# Patient Record
Sex: Female | Born: 1975 | Race: Black or African American | Hispanic: No | Marital: Married | Smoking: Never smoker
Health system: Southern US, Community
[De-identification: ages and names within clinical notes are randomized; demographics above are authoritative.]

## PROBLEM LIST (undated history)

## (undated) DIAGNOSIS — N3090 Cystitis, unspecified without hematuria: Secondary | ICD-10-CM

## (undated) DIAGNOSIS — Z9889 Other specified postprocedural states: Secondary | ICD-10-CM

## (undated) DIAGNOSIS — R112 Nausea with vomiting, unspecified: Secondary | ICD-10-CM

## (undated) DIAGNOSIS — D219 Benign neoplasm of connective and other soft tissue, unspecified: Secondary | ICD-10-CM

## (undated) DIAGNOSIS — K219 Gastro-esophageal reflux disease without esophagitis: Secondary | ICD-10-CM

## (undated) DIAGNOSIS — D759 Disease of blood and blood-forming organs, unspecified: Secondary | ICD-10-CM

## (undated) DIAGNOSIS — N904 Leukoplakia of vulva: Secondary | ICD-10-CM

## (undated) DIAGNOSIS — R102 Pelvic and perineal pain unspecified side: Secondary | ICD-10-CM

## (undated) DIAGNOSIS — G43909 Migraine, unspecified, not intractable, without status migrainosus: Secondary | ICD-10-CM

## (undated) DIAGNOSIS — K589 Irritable bowel syndrome without diarrhea: Secondary | ICD-10-CM

## (undated) DIAGNOSIS — B379 Candidiasis, unspecified: Secondary | ICD-10-CM

## (undated) DIAGNOSIS — Z8619 Personal history of other infectious and parasitic diseases: Secondary | ICD-10-CM

## (undated) DIAGNOSIS — B019 Varicella without complication: Secondary | ICD-10-CM

## (undated) DIAGNOSIS — E611 Iron deficiency: Secondary | ICD-10-CM

## (undated) HISTORY — DX: Varicella without complication: B01.9

## (undated) HISTORY — DX: Personal history of other infectious and parasitic diseases: Z86.19

## (undated) HISTORY — DX: Migraine, unspecified, not intractable, without status migrainosus: G43.909

## (undated) HISTORY — DX: Irritable bowel syndrome without diarrhea: K58.9

## (undated) HISTORY — DX: Benign neoplasm of connective and other soft tissue, unspecified: D21.9

## (undated) HISTORY — DX: Pelvic and perineal pain: R10.2

## (undated) HISTORY — DX: Candidiasis, unspecified: B37.9

## (undated) HISTORY — DX: Iron deficiency: E61.1

## (undated) HISTORY — DX: Pelvic and perineal pain unspecified side: R10.20

## (undated) HISTORY — DX: Cystitis, unspecified without hematuria: N30.90

## (undated) HISTORY — DX: Leukoplakia of vulva: N90.4

## (undated) HISTORY — PX: LAPAROSCOPY FOR ECTOPIC PREGNANCY: SUR765

## (undated) HISTORY — PX: ABDOMINAL HYSTERECTOMY: SHX81

## (undated) HISTORY — PX: APPENDECTOMY: SHX54

---
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
## 1997-08-01 ENCOUNTER — Emergency Department (HOSPITAL_COMMUNITY): Admission: EM | Admit: 1997-08-01 | Discharge: 1997-08-02 | Payer: Self-pay | Admitting: Emergency Medicine

## 1998-01-16 ENCOUNTER — Inpatient Hospital Stay (HOSPITAL_COMMUNITY): Admission: AD | Admit: 1998-01-16 | Discharge: 1998-01-16 | Payer: Self-pay | Admitting: Obstetrics and Gynecology

## 1998-03-28 ENCOUNTER — Inpatient Hospital Stay (HOSPITAL_COMMUNITY): Admission: AD | Admit: 1998-03-28 | Discharge: 1998-03-30 | Payer: Self-pay | Admitting: *Deleted

## 1998-05-02 ENCOUNTER — Other Ambulatory Visit: Admission: RE | Admit: 1998-05-02 | Discharge: 1998-05-02 | Payer: Self-pay | Admitting: *Deleted

## 2003-12-05 ENCOUNTER — Other Ambulatory Visit: Admission: RE | Admit: 2003-12-05 | Discharge: 2003-12-05 | Payer: Self-pay | Admitting: Family Medicine

## 2003-12-18 ENCOUNTER — Ambulatory Visit (HOSPITAL_COMMUNITY): Admission: RE | Admit: 2003-12-18 | Discharge: 2003-12-18 | Payer: Self-pay | Admitting: Obstetrics and Gynecology

## 2004-04-08 ENCOUNTER — Encounter: Admission: RE | Admit: 2004-04-08 | Discharge: 2004-04-08 | Payer: Self-pay | Admitting: Obstetrics and Gynecology

## 2005-01-09 ENCOUNTER — Other Ambulatory Visit: Admission: RE | Admit: 2005-01-09 | Discharge: 2005-01-09 | Payer: Self-pay | Admitting: Family Medicine

## 2006-06-22 ENCOUNTER — Encounter: Admission: RE | Admit: 2006-06-22 | Discharge: 2006-06-22 | Payer: Self-pay | Admitting: Family Medicine

## 2006-08-05 ENCOUNTER — Other Ambulatory Visit: Admission: RE | Admit: 2006-08-05 | Discharge: 2006-08-05 | Payer: Self-pay | Admitting: Family Medicine

## 2008-06-13 ENCOUNTER — Other Ambulatory Visit: Admission: RE | Admit: 2008-06-13 | Discharge: 2008-06-13 | Payer: Self-pay | Admitting: Family Medicine

## 2008-06-15 ENCOUNTER — Encounter: Admission: RE | Admit: 2008-06-15 | Discharge: 2008-06-15 | Payer: Self-pay | Admitting: Family Medicine

## 2008-08-21 ENCOUNTER — Encounter: Admission: RE | Admit: 2008-08-21 | Discharge: 2008-08-21 | Payer: Self-pay | Admitting: Family Medicine

## 2009-09-07 ENCOUNTER — Other Ambulatory Visit: Admission: RE | Admit: 2009-09-07 | Discharge: 2009-09-07 | Payer: Self-pay | Admitting: Family Medicine

## 2010-03-03 HISTORY — PX: LAPAROSCOPY: SHX197

## 2010-03-03 HISTORY — PX: TUBAL LIGATION: SHX77

## 2010-04-10 ENCOUNTER — Ambulatory Visit
Admission: RE | Admit: 2010-04-10 | Discharge: 2010-04-10 | Disposition: A | Discharge status: Home / Self Care | Payer: BC Managed Care – PPO | Source: Ambulatory Visit | Attending: Family Medicine | Admitting: Family Medicine

## 2010-04-10 ENCOUNTER — Other Ambulatory Visit: Payer: Self-pay | Admitting: Family Medicine

## 2010-04-10 DIAGNOSIS — R928 Other abnormal and inconclusive findings on diagnostic imaging of breast: Secondary | ICD-10-CM

## 2010-04-16 ENCOUNTER — Ambulatory Visit
Admission: RE | Admit: 2010-04-16 | Discharge: 2010-04-16 | Disposition: A | Discharge status: Home / Self Care | Payer: BC Managed Care – PPO | Source: Ambulatory Visit | Attending: Family Medicine | Admitting: Family Medicine

## 2010-04-16 ENCOUNTER — Other Ambulatory Visit: Payer: Self-pay | Admitting: Family Medicine

## 2010-04-16 DIAGNOSIS — N6310 Unspecified lump in the right breast, unspecified quadrant: Secondary | ICD-10-CM

## 2010-04-16 DIAGNOSIS — R928 Other abnormal and inconclusive findings on diagnostic imaging of breast: Secondary | ICD-10-CM

## 2010-05-13 DIAGNOSIS — N3090 Cystitis, unspecified without hematuria: Secondary | ICD-10-CM

## 2010-05-13 DIAGNOSIS — G43909 Migraine, unspecified, not intractable, without status migrainosus: Secondary | ICD-10-CM

## 2010-05-13 DIAGNOSIS — K589 Irritable bowel syndrome without diarrhea: Secondary | ICD-10-CM

## 2010-05-13 DIAGNOSIS — B019 Varicella without complication: Secondary | ICD-10-CM

## 2010-05-13 HISTORY — DX: Varicella without complication: B01.9

## 2010-05-13 HISTORY — DX: Migraine, unspecified, not intractable, without status migrainosus: G43.909

## 2010-05-13 HISTORY — DX: Irritable bowel syndrome, unspecified: K58.9

## 2010-05-13 HISTORY — DX: Cystitis, unspecified without hematuria: N30.90

## 2010-05-31 NOTE — H&P (Signed)
[name], [name]               ACCOUNT NO.:  [id number]  MEDICAL RECORD NO.:  [id number]           PATIENT TYPE:  O  LOCATION:  [facility]                           [facility]ILITY:  [facility]  PHYSICIAN:  Hal Morales, M.D.DATE OF BIRTH:  05-09-1975  DATE OF ADMISSION: DATE OF DISCHARGE:                             HISTORY & PHYSICAL   HISTORY OF PRESENT ILLNESS:  The patient is a 35 year old black female who presents complaining of severe dysmenorrhea.  She also has some intermittent pelvic pain that is not associated with her menses.  This has been going on for approximately 3 years.  The pain is normally on the left side, but has also been bilateral at times.  She has had an ultrasound of her pelvis approximately 2 years ago, which showed an ovarian cyst; however, her most recent ultrasound shows normal ovaries bilaterally.  The pain can last as much as 7 days after her menses.  She usually does require over-the-counter pain medication.  She rarely misses any of her usual activities including work because of this pain.  She has menses that ranging in length from every 21 to every 28 days and occasionally will have menses less than 21 days apart.  She does occasionally have intermenstrual bleeding.  OBSTETRICAL HISTORY:  The patient had normal spontaneous vaginal deliveries 21 years ago and 12 years ago.  She did in 1994 had an ectopic pregnancy with removal of her fallopian tubes, though she does not remember which side.  GYN HISTORY:  She has no history of abnormal Pap smears and had her most recent Pap smear in July 2011, which was normal.  She has also had a laparoscopic tubal cautery in 2006 that she uses for contraception.  She has a history of gonorrhea in the remote past, which was appropriately treated.  SURGICAL HISTORY:  She underwent an open appendectomy at age 58.  She had surgery for left leg fracture. She has also had the aforementioned laparoscopic tubal cautery and  salpingectomy for ectopic.  MEDICAL HISTORY:  The patient has a history of migraines with her menses, which respond to extra strength Tylenol.  She has a history of irritable bowel syndrome with constipation, usually heralded by severe abdominal pain.  CURRENT MEDICATIONS:  None regularly.  DRUG ALLERGIES:  None known.  FAMILY HISTORY:  Positive for migraine headaches.  REVIEW OF SYSTEMS:  Positive for urinary frequency, which is a long-term concern.  The aforementioned constipation with irritable bowel syndrome. She also sometimes has nausea associated with abdominal pain.  She has premenstrual symptomatology, primarily mood swings and premenstrual headache and abdominal pain.  SOCIAL HISTORY:  The patient works in Clinical biochemist.  She is of the [place] religion.  She is married.  She does not smoke cigarettes. She may have alcoholic beverage once or twice per week.  PHYSICAL EXAMINATION:  GENERAL:  The patient is a well-developed and black female in no acute distress. VITAL SIGNS:  Blood pressure is 110/70, weight is 147 pounds, height 5 feet 4-3/4 inches. HEENT:  The thyroid is normal. LUNGS:  Clear. HEART:  Regular rate and rhythm. ABDOMEN:  Soft without masses or organomegaly. PELVIC:  External genitalia show hypopigmentation at the posterior fourchette and on the left clitoral hood.  The vagina is rugous.  The cervix is without gross lesions and nontender.  The uterus is upper limits of normal size with decreased mobility.  Adnexae no masses. Rectovaginal no masses.  Ultrasound showed the uterus to be anteverted and measures 4.7 x 9 x 5.2.  The right and left ovaries are normal with no fluid in the cul-de-sac and endometrium appears normal.  There is a posterior subserosal fibroid measuring 1.89 x 2.31 cm.  IMPRESSION: 1. Three-year history of perimenstrual and intermenstrual pelvic pain. 2. Hypopigmentation of the posterior fourchette, question lichen      sclerosus.  DISPOSITION:  Options for management of the patient's pelvic pain were reviewed and recommendation made for laparoscopy.  The indications, risks and benefits of laparoscopy were reviewed including the risk of anesthesia, bleeding, infection and damage to adjacent organs.  The patient seemed to understand and wishes to proceed.  This will be done at [facility] on June 14, 2010, at which time, she will also undergo vulvar biopsy.     Hal Morales, M.D.     VPH/MEDQ  D:  05/29/2010  T:  05/30/2010  Job:  [id number]  Electronically Signed by Dierdre Forth M.D. on 05/31/2010 05:04:30 PM

## 2010-06-02 HISTORY — PX: LAPAROSCOPIC OVARIAN CYSTECTOMY: SUR786

## 2010-06-07 ENCOUNTER — Encounter (HOSPITAL_COMMUNITY)
Admission: RE | Admit: 2010-06-07 | Discharge: 2010-06-07 | Disposition: A | Discharge status: Home / Self Care | Payer: BC Managed Care – PPO | Source: Ambulatory Visit | Attending: Obstetrics and Gynecology | Admitting: Obstetrics and Gynecology

## 2010-06-07 LAB — SURGICAL PCR SCREEN: Staphylococcus aureus: NEGATIVE

## 2010-06-07 LAB — CBC
MCH: 29.7 pg (ref 26.0–34.0)
MCHC: 32.1 g/dL (ref 30.0–36.0)
RBC: 4.07 MIL/uL (ref 3.87–5.11)
WBC: 5.8 10*3/uL (ref 4.0–10.5)

## 2010-06-14 ENCOUNTER — Ambulatory Visit (HOSPITAL_COMMUNITY)
Admission: RE | Admit: 2010-06-14 | Discharge: 2010-06-14 | Disposition: A | Discharge status: Home / Self Care | Payer: BC Managed Care – PPO | Source: Ambulatory Visit | Attending: Obstetrics and Gynecology | Admitting: Obstetrics and Gynecology

## 2010-06-14 ENCOUNTER — Other Ambulatory Visit: Payer: Self-pay | Admitting: Obstetrics and Gynecology

## 2010-06-14 DIAGNOSIS — N8000 Endometriosis of the uterus, unspecified: Secondary | ICD-10-CM | POA: Insufficient documentation

## 2010-06-14 DIAGNOSIS — N904 Leukoplakia of vulva: Secondary | ICD-10-CM | POA: Insufficient documentation

## 2010-06-14 DIAGNOSIS — L940 Localized scleroderma [morphea]: Secondary | ICD-10-CM | POA: Insufficient documentation

## 2010-06-14 DIAGNOSIS — N831 Corpus luteum cyst of ovary, unspecified side: Secondary | ICD-10-CM | POA: Insufficient documentation

## 2010-06-14 DIAGNOSIS — N949 Unspecified condition associated with female genital organs and menstrual cycle: Secondary | ICD-10-CM | POA: Insufficient documentation

## 2010-06-14 DIAGNOSIS — D252 Subserosal leiomyoma of uterus: Secondary | ICD-10-CM | POA: Insufficient documentation

## 2010-06-14 DIAGNOSIS — N946 Dysmenorrhea, unspecified: Secondary | ICD-10-CM | POA: Insufficient documentation

## 2010-06-14 LAB — PREGNANCY, URINE: Preg Test, Ur: NEGATIVE

## 2010-07-19 NOTE — Op Note (Signed)
[name], [name]               ACCOUNT NO.:  [id number]   MEDICAL RECORD NO.:  [id number]          PATIENT TYPE:  AMB   LOCATION:  [facility]                           [facility]  [facility]   PHYSICIAN:  Charles A. Delcambre, MDDATE OF BIRTH:  1975-05-17   DATE OF PROCEDURE:  12/18/2003  DATE OF DISCHARGE:                                 OPERATIVE REPORT   PREOPERATIVE DIAGNOSIS:  Undesired fertility.   POSTOPERATIVE DIAGNOSES:  1.  Undesired fertility.  2.  Pelvic adhesive disease.  3.  Right salpingectomy previous consistent with ectopic pregnancy by      history.   PROCEDURE:  Laparoscopic tubal ligation, left, by bipolar cautery.   SURGEON:  Charles A. Delcambre, MD   ASSISTANT:  None.   COMPLICATIONS:  None.   ESTIMATED BLOOD LOSS:  Less than 10 mL.   OPERATIVE FINDINGS:  Right previous salpingectomy.  Pelvic adhesive disease  with dense adhesions, right round ligament to the anterior abdominal wall.  Right colon adherent to the anterior abdominal wall.  Some dense and some  filmy adhesions.  Liver adherent with adhesions to the right anterior  abdominal wall.   SPECIMENS:  None.   ANESTHESIA:  General via endotracheal route.   DESCRIPTION OF PROCEDURE:  The patient was taken to the operating room and  placed in the supine position.  General anesthetic was induced without  difficulty.  The patient was placed in the dorsal lithotomy position and  sterilely prepped and draped in universal stirrups.  As the patient was  placed, an acorn cannula was used with the single-tooth tenaculum on the  anterior lip of the cervix.  Attention was turned to the abdomen.  Umbilical  and suprapubic areas were injected with 0.25% plain Marcaine, a total of 10  mL divided.  Approximately a 1 cm incision was made at the umbilicus and  Veress needle was placed with anterior traction on the abdominal wall.  Aspiration with hanging drop test and insufflation to a pressure of less  than 10 mmHg, 1  L insufflation indicating intraperitoneal location.  Insufflation was carried out to adequate pneumoperitoneum of 2.5 L.  The  Veress needle was removed and an 11 mm port was then placed with anterior  traction on the abdominal wall.  Immediate verification of intraperitoneal  location was made by placement of the scope.  There was no damage to bowel,  bladder, nor vascular structures.  __________ was undertaken under direct  visualization.  Falope ring applicator was placed through the lower incision  site two fingerbreadths above the symphysis pubis.  Falope ring was  attempted on the left fallopian tube, but this tube was somewhat blunted and  the Falope ring could not adequately be placed; therefore, this procedure  was abandoned.  Bipolar cautery was used instead, and approximately 3 cm of  tube was bipolar cauterized to 0 current.  Adequate portion of the tube,  including the mesosalpinx as well as the tubal lumen, was cauterized to  destruction, and there was good hemostasis.  All areas were visualized.  Good hemostasis was noted.  Trocar sites  were of good hemostasis at low  pressure.  The peritoneal edges were visualized upon removal in the trocar  sleeves.  The skin was then closed after desufflation with 3-0 Monocryl  subcuticular stitch.  Some sterile bandages were applied.  The patient  tolerated the procedure well.  Vaginal instruments were removed.  Hemostasis  was adequate.  The patient was awakened and taken to recovery with physician  in attendance after being extubated.      CAD/MEDQ  D:  12/18/2003  T:  12/19/2003  Job:  [id number]

## 2010-07-19 NOTE — H&P (Signed)
NAMEBELLA, [name]               ACCOUNT NO.:  [id number]   MEDICAL RECORD NO.:  [id number]          PATIENT TYPE:  AMB   LOCATION:  [facility]                           [facility]   PHYSICIAN:  Charles A. Delcambre, MDDATE OF BIRTH:  [birth year]   DATE OF ADMISSION:  DATE OF DISCHARGE:                                HISTORY & PHYSICAL   HISTORY OF PRESENT ILLNESS:  The patient to be admitted December 18, 2003 for  a laparoscopic tubal ligation for sterilization.  She is a 35 year old para  2-0-0-2 who gives informed consent and accepts risks of infection, bleeding,  bowel and bladder damage, failure of approximately 1/400 risk, blood product  risks including hepatitis and HIV.  Alternative forms of contraception have  been discussed including IUD and she wishes to proceed with laparoscopic  tubal ligation.   PAST MEDICAL HISTORY:  Overactive bladder.   PAST SURGICAL HISTORY:  Lower leg fracture, appendectomy, tubal pregnancy  with one tube removed - she is not sure which.   MEDICATIONS:  Detrol LA 4 mg once a day.   ALLERGIES:  No known drug allergies.   SOCIAL HISTORY:  No tobacco, ethanol, or drug use, or STD exposure in the  past.  Married in a monogamous relationship with her husband.   FAMILY HISTORY:  Negative.   REVIEW OF SYSTEMS:  General negative full review.   PHYSICAL EXAMINATION:  GENERAL:  Alert and oriented x3.  HEENT:  Generally grossly negative.  NECK:  Supple without thyromegaly or adenopathy.  LUNGS:  Clear bilaterally.  BACK:  No CVAT.  HEART:  Regular rate and rhythm without murmur, rub, or gallop.  BREASTS:  Deferred, symmetrical.  Reports normal exam with PCP several weeks  ago.  ABDOMEN:  Soft, flat, nontender.  Umbilical scar is noted from previous  laparoscopy.  PELVIC:  Normal external female genitalia.  Bartholin, urethral, Skene's  within normal limits.  Vault without discharge or lesions.  Multiparous  cervix.  On bimanual examination uterus  midplane, slightly anteverted, not  enlarged.  Adnexa nontender without masses.  Ovaries palpably of normal size  bilaterally.  RECTAL:  Not done.  EXTREMITIES:  No edema.   ASSESSMENT:  Multiparity, desires sterilization.   PLAN:  Laparoscopic tubal ligation, preoperative CBC.  Questions were  answered and consent is given.  Will proceed as outlined.      CAD/MEDQ  D:  12/15/2003  T:  12/15/2003  Job:  [id number]

## 2010-07-23 NOTE — Op Note (Signed)
[name], [name]               ACCOUNT NO.:  [id number]  MEDICAL RECORD NO.:  [id number]           PATIENT TYPE:  O  LOCATION:  [facility]                          [facility]  PHYSICIAN:  Hal Morales, M.D.DATE OF BIRTH:  [birth year]  DATE OF PROCEDURE:  06/14/2010 DATE OF DISCHARGE:                              OPERATIVE REPORT   PREOPERATIVE DIAGNOSES:  Pelvic pain, vulvar dystrophy.  POSTOPERATIVE DIAGNOSES:  Pelvic pain, vulvar dystrophy, left ovarian cyst, pelvic adhesions, rule out endometriosis, uterine fibroids.  PROCEDURE:  Operative laparoscopy, left ovarian cystectomy, lysis of adhesions, posterior uterine biopsy, and vulvar biopsy.  SURGEON:  Hal Morales, MD.  ANESTHESIA:  General orotracheal.  ESTIMATED BLOOD LOSS:  Less than 10 mL.  COMPLICATIONS:  None.  FINDINGS:  The vulvar contained hypopigmented lesions at the posterior fourchette and on either side of the clitoris in small less than 1 cmpatches.  The uterus is upper limits of normal size with small less than 1 cm myomas subserosally on the anterior fundus x2.  The tubes are status post salpingectomy bilaterally, one for an ectopic and one for sterilization.  The right ovary is connected to the uterus by the utero- ovarian ligament and filmy mesosalpingeal connections to the round ligament.  There are no pelvic sidewall adhesions and no stigmata of endometriosis.  The right round ligament suspended to the anterior abdominal wall by filmy and dense adhesions.  On the left side, the left ovary is densely adherent to the posterior left uterus with a vesicular bleb erythematous lesion that could be consistent with endometriosis. The left round ligament is adherent to the anterior abdominal wall with filmy adhesions.  The posterior cul-de-sac is completely freed.  The uterosacral ligaments are visible and the posterior cul-de-sac is without stigmata of endometriosis or other adhesions or lesions.   The left ovary is enlarged with a 4-cm simple cyst with serous fluid and a smooth cyst wall.  The right ovary contains a 2 cm follicular like cyst.  DESCRIPTION OF PROCEDURE:  The patient was taken to the operating room after appropriate identification, and placed on the operating table. After the attainment of adequate general anesthesia, she was placed in modified lithotomy position.  The abdomen was prepped with ChloraPrep and the perineum and vagina prepped with multiple layers of Betadine.  A Foley catheter was inserted into the bladder and connected to straight drainage.  The abdomen and perineum were draped as a sterile field.  A single-tooth tenaculum was placed on the anterior cervix.  Subumbilical and suprapubic injections of 0.25 Marcaine for total of 10 mL were undertaken.  A subumbilical incision was made and carried down to the fascia.  The fascia was incised and the peritoneum entered.  A pursestring suture was placed through the fascia and peritoneum and the Hasson cannula placed into the peritoneal cavity with the pursestring suture cinched up to provide an airtight field.  It was then wrapped around the Hasson cannula and a pneumoperitoneum was created.  The laparoscope was placed through the trocar sleeve.  A suprapubic incision was made to the left of midline and a laparoscopic  probe trocar placed through that incision into the peritoneal cavity under direct visualization.  The above-noted findings were made and documented. Lysis of adhesions was carried out on the right and left side in the area of the round ligament, freeing the uterus to its normal position in the pelvis.  The left ovary was incised at the site of the ovarian cyst and the fluid allowed to egress.  The ovarian cyst wall was then grasped with toothed grasper and with contralateral traction was removed from the ovarian stroma and brought through the laparoscopic trocar to be removed from the operative  field.  Hemostasis was achieved with bipolar cautery and copious irrigation.  The posterior uterine connection between the ovary and the uterus was then dissected off allowing for a posterior uterine biopsy containing the aforementioned endometriosis like lesion.  This was removed from the operative field and sent as a separate specimen.  Copious irrigation was carried out and hemostasis noted to be adequate.  Approximately 60 mL of warm lactated Ringer's was left in the peritoneal cavity.  The laparoscopic instruments were removed from the peritoneal cavity as the CO2 was allowed to escape. The pursestring suture on the fascia and peritoneum in the umbilicus was tied down and a figure-of-eight suture in the fascia was placed all of 0 Vicryl.  A skin suture of 3-0 Monocryl was used to close the skin incision at the umbilicus and Dermabond was used on the left lower quadrant incision.  The site of operation was moved to the perineum where the aforementioned hypopigmented area at the perineum was grasped and a 1 cm area excised. A suture of 3-0 Monocryl was used to close the defect with adequate hemostasis.  At this time, the single-tooth tenaculum was removed and the patient was awakened from general anesthesia in satisfactory condition, having tolerated the procedure well, with sponge and instrument counts correct.  A Foley catheter was removed and the patient was taken to the recovery room in satisfactory condition.  SPECIMENS TO PATHOLOGY:  Left ovarian cyst wall, uterine biopsy, vulvar biopsy.  DISCHARGE INSTRUCTIONS:  Printed instructions for laparoscopy from the [facility].  DISCHARGE MEDICATIONS: 1. Ibuprofen 600 mg p.o. q.6 h. for 3 days and q. 6 h. p.r.n. pain. 2. Vicodin 5/500 mg one to two p.o. q.4 h. p.r.n., severe pain.  FOLLOWUP:  The patient will follow up in 2 weeks with Dr. Pennie Rushing.     Hal Morales, M.D.     VPH/MEDQ  D:  06/14/2010  T:   06/15/2010  Job:  [id number]  Electronically Signed by Dierdre Forth M.D. on 07/23/2010 10:32:52 PM

## 2010-10-10 ENCOUNTER — Other Ambulatory Visit: Payer: Self-pay | Admitting: Obstetrics and Gynecology

## 2010-10-10 DIAGNOSIS — D219 Benign neoplasm of connective and other soft tissue, unspecified: Secondary | ICD-10-CM

## 2010-10-10 DIAGNOSIS — N904 Leukoplakia of vulva: Secondary | ICD-10-CM

## 2010-10-10 HISTORY — DX: Leukoplakia of vulva: N90.4

## 2010-10-10 HISTORY — DX: Benign neoplasm of connective and other soft tissue, unspecified: D21.9

## 2010-10-16 NOTE — H&P (Signed)
[name], [name]               ACCOUNT NO.:  [id number]  MEDICAL RECORD NO.:  [id number]  LOCATION:  [facility]                         [facility]ACILITY:  [facility]  PHYSICIAN:  Hal Morales, M.D.DATE OF BIRTH:  1976/01/01  DATE OF ADMISSION:  10/10/2010 DATE OF DISCHARGE:                             HISTORY & PHYSICAL   HISTORY OF PRESENT ILLNESS:  Ms. Petitjean is a 35 year old married African American female para 2-0-1-2 presenting for a laparoscopically-assisted vaginal hysterectomy because of chronic pelvic pain, endometriosis, and uterine fibroids.  The patient gives a long history of worsening pelvic pain particularly with and around the time of her menstrual flow.  She rates this pain as a 10/10 on a 10-point pain scale.  However, she is able to achieve some relief (6/10 on a 10-point pain scale) by using heat, drinking a warm beverage, and taking ibuprofen 800 mg.  The patient's menstrual flow will last 4 days during which time she changes a pad every 3 hours but denies any intermenstrual bleeding, painful bowel movements or dysuria.  She goes on to say that she experiences dyspareunia and urinary frequency.  The patient had been given a trial of Provera 30 Mg to take for her symptoms however, she states that this medication "did not agree with her." In April of this year Ms. Schank underwent a diagnostic laparoscopy with ovarian cystectomy that revealed the diagnosis of endometriosis and a benign corpus luteum cyst.  A pelvic ultrasound in March 2012 showed a uterus measuring 9.04 x 5.26 x 4.78 cm with the left portion of the uterus appearing somewhat bulky.  The patient also was noted to have a posterior subserosal fibroid measuring 1.89 x 2.13 x 1.07 cm with normal-appearing ovaries bilaterally.  A review of both medical and surgical management options were given to the patient, however, since she has completed her childbearing and was intolerant of recent medical therapies she has  decided to proceed with definitive therapy in the form of hysterectomy.  OBSTETRICAL HISTORY:  Gravida 3, para 2-0-1-2.  The patient had a spontaneous vaginal birth in 40 and 2000.  Her largest vaginal delivery was 7 pounds 8 ounces.  GYNECOLOGICAL HISTORY:  Menarche at 35 years old.  Last menstrual period, September 30, 2010.  She uses tubal ligation as her method of contraception.  Denies any history of abnormal Pap smears.  She has a remote history of gonorrhea.  Her last normal Pap smear was in 2011.  MEDICAL HISTORY: 1. Irritable bowel syndrome. 2. Migraines. 3. Lichen sclerosus of the vulva. 4. Endometriosis. 5. Uterine fibroids.  SURGICAL HISTORY:  In 1988, left leg surgery; 1994, appendectomy and laparoscopy because of an ectopic pregnancy; 2006, bilateral tubal ligation; 2012, laparoscopic ovarian cystectomy with ablation of endometriosis.  She denies any history of blood transfusions.  She does states that anesthesia causes her nausea.  FAMILY HISTORY:  Positive for migraines.  HABITS:  She does not use tobacco or illicit drugs and she rarely consumes alcohol.  SOCIAL HISTORY:  The patient is married and she works in Clinical biochemist for [employer].  CURRENT MEDICATIONS: 1. Multivitamins daily. 2. B12 occasionally. 3. Ibuprofen 800 mg every 8 hours with food as  needed for pain. 4. Tramadol 50 mg every 6 hours as needed for pain.  ALLERGIES:  She has no known drug allergies.  She denies any sensitivities to peanuts, shellfish, latex, or soy.  REVIEW OF SYSTEMS:  The patient does have a partial plate in her lower jaw.  She denies any chest pain, shortness of breath, headache, vision changes, difficulty swallowing, chest pain, shortness of breath, nausea, vomiting, diarrhea, flank pain, dysuria, hematuria, vaginitis symptoms, fever, myalgias, arthralgias, and except as is mentioned in history of present illness.  The patient's review of systems is otherwise  negative.  PHYSICAL EXAMINATION:  VITAL SIGNS:  Blood pressure 114/72, pulse is 68, respirations 14, temperature 98.5 degrees Fahrenheit orally, weight 148 pounds, height 5 feet 5 inches tall.  Body mass index 24. NECK:  Supple without masses.  There is no thyromegaly or cervical adenopathy. HEART:  Regular rate and rhythm. LUNGS:  Clear. BACK:  No CVA tenderness. ABDOMEN:  Soft.  It is diffusely tender in both lower quadrants without any guarding, rebound, masses, or organomegaly. EXTREMITIES:  No clubbing, cyanosis, or edema. PELVIC:  EGBUS is normal.  Vagina is normal.  Cervix is nontender without lesions.  Uterus appears 8-10 weeks' size and is tender.  Adnexa without any palpable masses, however, there is tenderness bilaterally.  IMPRESSION: 1. Pelvic pain. 2. Endometriosis. 3. Uterine fibroids.  DISPOSITION:  A discussion was held with the patient regarding indications for her procedure along with its risks which include but are not limited to reaction to anesthesia, damage to adjacent organs, infection, excessive bleeding, and the possible need for an open abdominal incision.  The patient verbalized understanding of these risk and has consented to proceed with a laparoscopically-assisted vaginal hysterectomy with the possibility of a total abdominal hysterectomy at [facility] [place] on October 30, 2010 at 10 o'clock a.m.    Sherri Mcarthy J. Lowell Guitar, P.A.-C.   ______________________________ Hal Morales, M.D.   EJP/MEDQ  D:  10/15/2010  T:  10/16/2010  Job:  [id number]

## 2010-10-24 ENCOUNTER — Other Ambulatory Visit (HOSPITAL_COMMUNITY): Payer: BC Managed Care – PPO

## 2010-10-25 ENCOUNTER — Encounter (HOSPITAL_COMMUNITY)
Admission: RE | Admit: 2010-10-25 | Discharge: 2010-10-25 | Disposition: A | Discharge status: Home / Self Care | Payer: BC Managed Care – PPO | Source: Ambulatory Visit | Attending: Obstetrics and Gynecology | Admitting: Obstetrics and Gynecology

## 2010-10-25 ENCOUNTER — Encounter (HOSPITAL_COMMUNITY): Payer: Self-pay

## 2010-10-25 HISTORY — DX: Gastro-esophageal reflux disease without esophagitis: K21.9

## 2010-10-25 HISTORY — DX: Nausea with vomiting, unspecified: R11.2

## 2010-10-25 HISTORY — DX: Other specified postprocedural states: Z98.890

## 2010-10-25 HISTORY — DX: Disease of blood and blood-forming organs, unspecified: D75.9

## 2010-10-25 LAB — CBC
HCT: 33.9 % — ABNORMAL LOW (ref 36.0–46.0)
MCV: 94.2 fL (ref 78.0–100.0)
Platelets: 227 10*3/uL (ref 150–400)
RBC: 3.6 MIL/uL — ABNORMAL LOW (ref 3.87–5.11)
RDW: 12.5 % (ref 11.5–15.5)
WBC: 4.8 10*3/uL (ref 4.0–10.5)

## 2010-10-25 NOTE — Patient Instructions (Addendum)
20 [name]  10/25/2010   Your procedure is scheduled on:  10/30/10  Enter through the Main Entrance of [facility] at 830 AM.  Pick up the phone at the desk and dial [phone].   Call this number if you have problems the morning of surgery: [phone]   Remember:   Do not eat food:After Midnight.  Do not drink clear liquids: After Midnight.  Take these medicines the morning of surgery with A SIP OF WATER: NA   Do not wear jewelry, make-up or nail polish.  Do not wear lotions, powders, or perfumes. You may wear deodorant.  Do not shave 48 hours prior to surgery.  Do not bring valuables to the hospital.  Contacts, dentures or bridgework may not be worn into surgery.  Leave suitcase in the car. After surgery it may be brought to your room.  For patients admitted to the hospital, checkout time is 11:00 AM the day of discharge.   Patients discharged the day of surgery will not be allowed to drive home.  Name and phone number of your driver: NA  Special Instructions: CHG Shower Use Special Wash: 1/2 bottle night before surgery and 1/2 bottle morning of surgery.   Please read over the following fact sheets that you were given: MRSA Information and Care and Recovery After Surgery

## 2010-10-29 MED ORDER — DEXTROSE 5 % IV SOLN
2.0000 g | INTRAVENOUS | Status: AC
  Administered 2010-10-30: 2 g via INTRAVENOUS
  Filled 2010-10-29: qty 2

## 2010-10-30 ENCOUNTER — Encounter (HOSPITAL_COMMUNITY): Payer: Self-pay

## 2010-10-30 ENCOUNTER — Encounter (HOSPITAL_COMMUNITY)
Admission: RE | Disposition: A | Discharge status: Home / Self Care | Payer: Self-pay | Source: Ambulatory Visit | Attending: Obstetrics and Gynecology

## 2010-10-30 ENCOUNTER — Encounter (HOSPITAL_COMMUNITY): Payer: Self-pay | Admitting: Anesthesiology

## 2010-10-30 ENCOUNTER — Ambulatory Visit (HOSPITAL_COMMUNITY)
Admission: RE | Admit: 2010-10-30 | Discharge: 2010-10-31 | Disposition: A | Discharge status: Home / Self Care | Payer: BC Managed Care – PPO | Source: Ambulatory Visit | Attending: Obstetrics and Gynecology | Admitting: Obstetrics and Gynecology

## 2010-10-30 ENCOUNTER — Other Ambulatory Visit: Payer: Self-pay | Admitting: Obstetrics and Gynecology

## 2010-10-30 ENCOUNTER — Ambulatory Visit (HOSPITAL_COMMUNITY): Payer: BC Managed Care – PPO | Admitting: Anesthesiology

## 2010-10-30 DIAGNOSIS — D250 Submucous leiomyoma of uterus: Secondary | ICD-10-CM | POA: Insufficient documentation

## 2010-10-30 DIAGNOSIS — Z01818 Encounter for other preprocedural examination: Secondary | ICD-10-CM | POA: Insufficient documentation

## 2010-10-30 DIAGNOSIS — R102 Pelvic and perineal pain unspecified side: Secondary | ICD-10-CM | POA: Diagnosis present

## 2010-10-30 DIAGNOSIS — Z01812 Encounter for preprocedural laboratory examination: Secondary | ICD-10-CM | POA: Insufficient documentation

## 2010-10-30 DIAGNOSIS — N803 Endometriosis of pelvic peritoneum, unspecified: Principal | ICD-10-CM | POA: Insufficient documentation

## 2010-10-30 DIAGNOSIS — N949 Unspecified condition associated with female genital organs and menstrual cycle: Secondary | ICD-10-CM | POA: Insufficient documentation

## 2010-10-30 DIAGNOSIS — N809 Endometriosis, unspecified: Secondary | ICD-10-CM | POA: Diagnosis present

## 2010-10-30 DIAGNOSIS — D252 Subserosal leiomyoma of uterus: Secondary | ICD-10-CM | POA: Insufficient documentation

## 2010-10-30 DIAGNOSIS — D251 Intramural leiomyoma of uterus: Secondary | ICD-10-CM | POA: Insufficient documentation

## 2010-10-30 HISTORY — PX: VAGINAL HYSTERECTOMY: SHX2639

## 2010-10-30 HISTORY — PX: LAPAROSCOPY: SHX197

## 2010-10-30 LAB — PREGNANCY, URINE: Preg Test, Ur: NEGATIVE

## 2010-10-30 SURGERY — LAPAROSCOPY, DIAGNOSTIC
Anesthesia: General | Site: Vagina | Wound class: Clean

## 2010-10-30 MED ORDER — ONDANSETRON HCL 4 MG/2ML IJ SOLN: 4.0000 mg | Freq: Four times a day (QID) | INTRAMUSCULAR | Status: DC | PRN

## 2010-10-30 MED ORDER — DEXAMETHASONE SODIUM PHOSPHATE 10 MG/ML IJ SOLN
INTRAMUSCULAR | Status: DC | PRN
  Administered 2010-10-30: 10 mg via INTRAVENOUS

## 2010-10-30 MED ORDER — NEOSTIGMINE METHYLSULFATE 1 MG/ML IJ SOLN
INTRAMUSCULAR | Status: AC
  Filled 2010-10-30: qty 10

## 2010-10-30 MED ORDER — ONDANSETRON HCL 4 MG PO TABS: 4.0000 mg | ORAL_TABLET | Freq: Four times a day (QID) | ORAL | Status: DC | PRN

## 2010-10-30 MED ORDER — MENTHOL 3 MG MT LOZG: 1.0000 | LOZENGE | OROMUCOSAL | Status: DC | PRN

## 2010-10-30 MED ORDER — DEXAMETHASONE SODIUM PHOSPHATE 10 MG/ML IJ SOLN
INTRAMUSCULAR | Status: AC
  Filled 2010-10-30: qty 1

## 2010-10-30 MED ORDER — ZOLPIDEM TARTRATE 5 MG PO TABS: 5.0000 mg | ORAL_TABLET | Freq: Every evening | ORAL | Status: DC | PRN

## 2010-10-30 MED ORDER — HYDROMORPHONE HCL 1 MG/ML IJ SOLN
INTRAMUSCULAR | Status: DC | PRN
  Administered 2010-10-30: 1 mg via INTRAVENOUS

## 2010-10-30 MED ORDER — KETOROLAC TROMETHAMINE 30 MG/ML IJ SOLN: 30.0000 mg | Freq: Four times a day (QID) | INTRAMUSCULAR | Status: DC

## 2010-10-30 MED ORDER — ROCURONIUM BROMIDE 50 MG/5ML IV SOLN
INTRAVENOUS | Status: AC
  Filled 2010-10-30: qty 1

## 2010-10-30 MED ORDER — HYDROMORPHONE 0.3 MG/ML IV SOLN
INTRAVENOUS | Status: AC
  Filled 2010-10-30: qty 25

## 2010-10-30 MED ORDER — HYDROMORPHONE HCL 1 MG/ML IJ SOLN
INTRAMUSCULAR | Status: AC
  Filled 2010-10-30: qty 1

## 2010-10-30 MED ORDER — ONDANSETRON HCL 4 MG/2ML IJ SOLN
INTRAMUSCULAR | Status: DC | PRN
  Administered 2010-10-30: 4 mg via INTRAVENOUS

## 2010-10-30 MED ORDER — LIDOCAINE HCL (CARDIAC) 20 MG/ML IV SOLN
INTRAVENOUS | Status: AC
  Filled 2010-10-30: qty 5

## 2010-10-30 MED ORDER — PROPOFOL 10 MG/ML IV EMUL
INTRAVENOUS | Status: AC
  Filled 2010-10-30: qty 20

## 2010-10-30 MED ORDER — MIDAZOLAM HCL 5 MG/5ML IJ SOLN
INTRAMUSCULAR | Status: DC | PRN
  Administered 2010-10-30: 2 mg via INTRAVENOUS

## 2010-10-30 MED ORDER — ONDANSETRON HCL 4 MG/2ML IJ SOLN
INTRAMUSCULAR | Status: AC
  Filled 2010-10-30: qty 2

## 2010-10-30 MED ORDER — SODIUM CHLORIDE 0.9 % IJ SOLN: 9.0000 mL | INTRAMUSCULAR | Status: DC | PRN

## 2010-10-30 MED ORDER — PANTOPRAZOLE SODIUM 40 MG PO TBEC
40.0000 mg | DELAYED_RELEASE_TABLET | Freq: Once | ORAL | Status: AC
  Administered 2010-10-30: 40 mg via ORAL

## 2010-10-30 MED ORDER — DIPHENHYDRAMINE HCL 12.5 MG/5ML PO ELIX
12.5000 mg | ORAL_SOLUTION | Freq: Four times a day (QID) | ORAL | Status: DC | PRN
  Administered 2010-10-31: 12.5 mg via ORAL
  Filled 2010-10-30: qty 5

## 2010-10-30 MED ORDER — SCOPOLAMINE 1 MG/3DAYS TD PT72
1.0000 | MEDICATED_PATCH | Freq: Once | TRANSDERMAL | Status: DC
  Administered 2010-10-30: 1.5 mg via TRANSDERMAL

## 2010-10-30 MED ORDER — VASOPRESSIN 20 UNIT/ML IJ SOLN
INTRAVENOUS | Status: DC | PRN
  Administered 2010-10-30 (×2): via INTRAMUSCULAR

## 2010-10-30 MED ORDER — PANTOPRAZOLE SODIUM 40 MG PO TBEC
DELAYED_RELEASE_TABLET | ORAL | Status: AC
  Filled 2010-10-30: qty 1

## 2010-10-30 MED ORDER — HYDROMORPHONE 0.3 MG/ML IV SOLN
INTRAVENOUS | Status: DC
  Administered 2010-10-30: 18:00:00 via INTRAVENOUS
  Administered 2010-10-30: 1.2 mg via INTRAVENOUS

## 2010-10-30 MED ORDER — FENTANYL CITRATE 0.05 MG/ML IJ SOLN
INTRAMUSCULAR | Status: DC | PRN
  Administered 2010-10-30 (×5): 50 ug via INTRAVENOUS

## 2010-10-30 MED ORDER — SCOPOLAMINE 1 MG/3DAYS TD PT72
MEDICATED_PATCH | TRANSDERMAL | Status: AC
  Filled 2010-10-30: qty 1

## 2010-10-30 MED ORDER — ESTRADIOL 0.1 MG/GM VA CREA
TOPICAL_CREAM | VAGINAL | Status: DC | PRN
  Administered 2010-10-30: 1 via VAGINAL

## 2010-10-30 MED ORDER — LACTATED RINGERS IV SOLN: INTRAVENOUS | Status: DC

## 2010-10-30 MED ORDER — KETOROLAC TROMETHAMINE 30 MG/ML IJ SOLN
30.0000 mg | Freq: Four times a day (QID) | INTRAMUSCULAR | Status: DC
  Administered 2010-10-31: 30 mg via INTRAVENOUS
  Filled 2010-10-30: qty 1

## 2010-10-30 MED ORDER — FENTANYL CITRATE 0.05 MG/ML IJ SOLN
INTRAMUSCULAR | Status: AC
  Filled 2010-10-30: qty 5

## 2010-10-30 MED ORDER — HYDROMORPHONE HCL 1 MG/ML IJ SOLN
0.2500 mg | INTRAMUSCULAR | Status: DC | PRN
  Administered 2010-10-30: 0.5 mg via INTRAVENOUS

## 2010-10-30 MED ORDER — KETOROLAC TROMETHAMINE 30 MG/ML IJ SOLN
INTRAMUSCULAR | Status: AC
  Filled 2010-10-30: qty 1

## 2010-10-30 MED ORDER — NEOSTIGMINE METHYLSULFATE 1 MG/ML IJ SOLN
INTRAMUSCULAR | Status: DC | PRN
  Administered 2010-10-30: 2 mg via INTRAMUSCULAR

## 2010-10-30 MED ORDER — LACTATED RINGERS IV SOLN
INTRAVENOUS | Status: DC
  Administered 2010-10-30 (×4): via INTRAVENOUS

## 2010-10-30 MED ORDER — BISACODYL 5 MG PO TBEC
5.0000 mg | DELAYED_RELEASE_TABLET | Freq: Every day | ORAL | Status: DC | PRN
  Filled 2010-10-30: qty 1

## 2010-10-30 MED ORDER — GLYCOPYRROLATE 0.2 MG/ML IJ SOLN
INTRAMUSCULAR | Status: AC
  Filled 2010-10-30: qty 1

## 2010-10-30 MED ORDER — GLYCOPYRROLATE 0.2 MG/ML IJ SOLN
INTRAMUSCULAR | Status: DC | PRN
  Administered 2010-10-30: 0.2 mg via INTRAVENOUS

## 2010-10-30 MED ORDER — MIDAZOLAM HCL 2 MG/2ML IJ SOLN
INTRAMUSCULAR | Status: AC
  Filled 2010-10-30: qty 2

## 2010-10-30 MED ORDER — DIPHENHYDRAMINE HCL 50 MG/ML IJ SOLN
12.5000 mg | Freq: Four times a day (QID) | INTRAMUSCULAR | Status: DC | PRN
  Administered 2010-10-30: 12.5 mg via INTRAVENOUS
  Filled 2010-10-30: qty 1

## 2010-10-30 MED ORDER — NALOXONE HCL 0.4 MG/ML IJ SOLN: 0.4000 mg | INTRAMUSCULAR | Status: DC | PRN

## 2010-10-30 MED ORDER — LIDOCAINE HCL (CARDIAC) 20 MG/ML IV SOLN
INTRAVENOUS | Status: DC | PRN
  Administered 2010-10-30: 100 mg via INTRAVENOUS

## 2010-10-30 MED ORDER — PROPOFOL 10 MG/ML IV EMUL
INTRAVENOUS | Status: DC | PRN
  Administered 2010-10-30: 180 mg via INTRAVENOUS

## 2010-10-30 MED ORDER — HYDROMORPHONE 0.3 MG/ML IV SOLN
INTRAVENOUS | Status: DC
  Administered 2010-10-31: 3 mg via INTRAVENOUS

## 2010-10-30 MED ORDER — OXYCODONE-ACETAMINOPHEN 5-325 MG PO TABS
1.0000 | ORAL_TABLET | ORAL | Status: DC | PRN
  Administered 2010-10-31 (×3): 2 via ORAL
  Filled 2010-10-30 (×3): qty 2

## 2010-10-30 MED ORDER — KETOROLAC TROMETHAMINE 30 MG/ML IJ SOLN
INTRAMUSCULAR | Status: DC | PRN
  Administered 2010-10-30: 30 mg via INTRAVENOUS

## 2010-10-30 MED ORDER — LACTATED RINGERS IV SOLN
INTRAVENOUS | Status: DC
  Administered 2010-10-31: 01:00:00 via INTRAVENOUS

## 2010-10-30 MED ORDER — ROCURONIUM BROMIDE 100 MG/10ML IV SOLN
INTRAVENOUS | Status: DC | PRN
  Administered 2010-10-30: 10 mg via INTRAVENOUS
  Administered 2010-10-30: 40 mg via INTRAVENOUS

## 2010-10-30 MED ORDER — BUPIVACAINE HCL (PF) 0.25 % IJ SOLN
INTRAMUSCULAR | Status: DC | PRN
  Administered 2010-10-30: 10 mL

## 2010-10-30 SURGICAL SUPPLY — 74 items
BLADE HEX COATED 2.75 (ELECTRODE) IMPLANT
CABLE HIGH FREQUENCY MONO STRZ (ELECTRODE) IMPLANT
CANISTER SUCTION 2500CC (MISCELLANEOUS) ×4 IMPLANT
CATH ROBINSON RED A/P 16FR (CATHETERS) IMPLANT
CHLORAPREP W/TINT 26ML (MISCELLANEOUS) ×4 IMPLANT
CLOTH BEACON ORANGE TIMEOUT ST (SAFETY) ×4 IMPLANT
CONT PATH 16OZ SNAP LID 3702 (MISCELLANEOUS) ×4 IMPLANT
CONT SPECI 4OZ STER CLIK (MISCELLANEOUS) IMPLANT
COVER TABLE BACK 60X90 (DRAPES) ×4 IMPLANT
DECANTER SPIKE VIAL GLASS SM (MISCELLANEOUS) IMPLANT
DERMABOND ADVANCED (GAUZE/BANDAGES/DRESSINGS) ×2 IMPLANT
DRAIN JACKSON PRT FLT 7MM (DRAIN) IMPLANT
DRAPE PROXIMA HALF (DRAPES) ×4 IMPLANT
DRAPE UTILITY XL STRL (DRAPES) ×4 IMPLANT
ELECT NDL TIP 2.8 STRL (NEEDLE) IMPLANT
ELECT NEEDLE TIP 2.8 STRL (NEEDLE) IMPLANT
ELECT REM PT RETURN 9FT ADLT (ELECTROSURGICAL) ×4
ELECTRODE REM PT RTRN 9FT ADLT (ELECTROSURGICAL) ×3 IMPLANT
EVACUATOR SILICONE 100CC (DRAIN) IMPLANT
EVACUATOR SMOKE 8.L (FILTER) ×4 IMPLANT
FORCEPS CUTTING 33CM 5MM (CUTTING FORCEPS) IMPLANT
FORCEPS CUTTING 45CM 5MM (CUTTING FORCEPS) IMPLANT
GAUZE PACKING 2X5 YD STERILE (GAUZE/BANDAGES/DRESSINGS) ×2 IMPLANT
GAUZE SPONGE 4X4 16PLY XRAY LF (GAUZE/BANDAGES/DRESSINGS) ×4 IMPLANT
GLOVE BIOGEL PI IND STRL 6.5 (GLOVE) ×3 IMPLANT
GLOVE BIOGEL PI INDICATOR 6.5 (GLOVE) ×1
GLOVE SURG SS PI 6.5 STRL IVOR (GLOVE) ×12 IMPLANT
GOWN PREVENTION PLUS LG XLONG (DISPOSABLE) ×16 IMPLANT
NDL HYPO 25X1 1.5 SAFETY (NEEDLE) IMPLANT
NDL SPNL 22GX3.5 QUINCKE BK (NEEDLE) ×2 IMPLANT
NEEDLE HYPO 25X1 1.5 SAFETY (NEEDLE) IMPLANT
NEEDLE MAYO .5 CIRCLE (NEEDLE) ×4 IMPLANT
NEEDLE SPNL 22GX3.5 QUINCKE BK (NEEDLE) ×4 IMPLANT
NS IRRIG 1000ML POUR BTL (IV SOLUTION) ×4 IMPLANT
PACK ABDOMINAL GYN (CUSTOM PROCEDURE TRAY) ×4 IMPLANT
PACK LAVH (CUSTOM PROCEDURE TRAY) ×4 IMPLANT
PAD OB MATERNITY 4.3X12.25 (PERSONAL CARE ITEMS) ×4 IMPLANT
SCALPEL HARMONIC ACE (MISCELLANEOUS) IMPLANT
SET IRRIG TUBING LAPAROSCOPIC (IRRIGATION / IRRIGATOR) IMPLANT
SOLUTION ELECTROLUBE (MISCELLANEOUS) IMPLANT
SPONGE LAP 18X18 X RAY DECT (DISPOSABLE) ×8 IMPLANT
STAPLER VISISTAT 35W (STAPLE) IMPLANT
STRIP CLOSURE SKIN 1/4X3 (GAUZE/BANDAGES/DRESSINGS) IMPLANT
SUT CHROMIC 2 0 TIES 18 (SUTURE) IMPLANT
SUT MNCRL AB 3-0 PS2 27 (SUTURE) IMPLANT
SUT PDS AB 1 CT  36 (SUTURE)
SUT PDS AB 1 CT 36 (SUTURE) IMPLANT
SUT PLAIN 2 0 XLH (SUTURE) IMPLANT
SUT SILK 0 FSL (SUTURE) IMPLANT
SUT VIC AB 0 CT1 18XCR BRD8 (SUTURE) ×9 IMPLANT
SUT VIC AB 0 CT1 27 (SUTURE)
SUT VIC AB 0 CT1 27XBRD ANBCTR (SUTURE) IMPLANT
SUT VIC AB 0 CT1 8-18 (SUTURE) ×12
SUT VIC AB 2-0 CT1 (SUTURE) ×4 IMPLANT
SUT VIC AB 2-0 SH 27 (SUTURE) ×8
SUT VIC AB 2-0 SH 27XBRD (SUTURE) ×6 IMPLANT
SUT VIC AB 3-0 PS2 18 (SUTURE) ×4
SUT VIC AB 3-0 PS2 18XBRD (SUTURE) ×3 IMPLANT
SUT VIC AB 3-0 SH 27 (SUTURE)
SUT VIC AB 3-0 SH 27X BRD (SUTURE) IMPLANT
SUT VICRYL 0 ENDOLOOP (SUTURE) IMPLANT
SUT VICRYL 0 TIES 12 18 (SUTURE) ×4 IMPLANT
SUT VICRYL 0 UR6 27IN ABS (SUTURE) IMPLANT
SYR 20CC LL (SYRINGE) ×4 IMPLANT
SYR 50ML LL SCALE MARK (SYRINGE) ×4 IMPLANT
SYR CONTROL 10ML LL (SYRINGE) IMPLANT
SYR TB 1ML 25GX5/8 (SYRINGE) IMPLANT
SYR TB 1ML LUER SLIP (SYRINGE) ×4 IMPLANT
TOWEL OR 17X24 6PK STRL BLUE (TOWEL DISPOSABLE) ×8 IMPLANT
TRAY FOLEY CATH 14FR (SET/KITS/TRAYS/PACK) ×4 IMPLANT
TROCAR BALL TOP DISP 5MM (ENDOMECHANICALS) ×4 IMPLANT
TROCAR Z-THREAD BLADED 11X100M (TROCAR) ×4 IMPLANT
WARMER LAPAROSCOPE (MISCELLANEOUS) ×4 IMPLANT
WATER STERILE IRR 1000ML POUR (IV SOLUTION) ×4 IMPLANT

## 2010-10-30 NOTE — Interval H&P Note (Signed)
History and Physical Interval Note:   10/30/2010   10:37 AM   [name]  has presented today for surgery, with the diagnosis of Pelvic Pain, Endometriosis, Fibroids  The various methods of treatment have been discussed with the patient and family. After consideration of risks, benefits and other options for treatment, the patient has consented to  Procedure(s): LAPAROSCOPIC ASSISTED VAGINAL HYSTERECTOMY HYSTERECTOMY ABDOMINAL as a surgical intervention .  I have reviewed the patients' chart and labs.  Questions were answered to the patient's satisfaction.     Taite Schoeppner P  MD   No change in H&P

## 2010-10-30 NOTE — Op Note (Signed)
Operative report for Promise Weldin medical record #[id number]  Preoperative diagnoses: #1 pelvic pain #2 endometriosis #3 uterine fibroids  Postoperative diagnoses:  Same  Operation: Diagnostic laparoscopy. Total vaginal hysterectomy   Anesthesia: Gen. Orotracheal Estimated blood loss: 150 cc Complications: None Findings: The uterus was upper limits of normal sized with a small myoma at the right uterine fundus and one near at the left tubo-ovarian junction. The patient was status post right salpingectomy for a tubal ectopic. The ovaries bilaterally were normal with no significant adhesions. There were stigmata of endometriosis in the posterior cul-de-sac consisting of peritoneal windows and some filmy adhesions. There was a power bone lesion on the left anterior uterine fundus and are on the left utero-ovarian ligament. The patient is status post appendectomy. Procedure: The patient was taken to the operating room after appropriate identification and placed on the operating table. After the attainment of adequate general anesthesia she was placed in the modified lithotomy position. The perineum and vagina were prepped with multiple layers of Betadine. The abdomen was prepped with chloraprep. A Foley catheter was inserted into the bladder under sterile conditions and connected to straight drainage. A Hulka tenaculum was placed on the anterior cervix. The abdomen and perineum were draped as a sterile field. Subumbilical injection of quarter percent Marcaine for a total of 8 cc was undertaken. A subumbilical incision approximately 8 mm in length was made. A varies cannula was placed through that incision into the peritoneal cavity. A pneumoperitoneum was created with 3 L of CO2. The varies cannula was removed and a 5 mm laparoscopic trocar placed through that incision into the peritoneal cavity. The laparoscope was placed through the trocar sleeve. The above-noted findings were made and documented. The  decision was made that a total vaginal hysterectomy could be achieved without further instrumentation and the laparoscope was removed from the trocar sleeve. The perineum became the site of operation. A weighted speculum was placed in the posterior vagina and the Hulka tenaculum replaced with 2 Lahey tenacula the on the anterior and posterior surfaces of the cervix. The cervical vaginal mucosa was injected with a  dilute solution of Pitressin. The cervix was circumscribed and the anterior vaginal mucosa bluntly dissected off the anterior cervix. The posterior cul-de-sac was entered sharply and the posterior peritoneum tagged with a suture. The uterosacral ligaments on the right and left side were successively clamped cut and suture ligated and those sutures held. The paracervical tissues, parametrial tissues, and uterine arteries were successively clamped cut and suture ligated. The uterus was inverted and the upper pedicles clamped cut and doubly suture ligated. The uterus and cervix were removed from the operative field on the right side additional tissue was removed from the upper pedicle in order to remove an area of endometriosis. Once that pedicle had been suture ligated hemostasis was noted to be adequate. A McCall culdoplasty suture was placed in the posterior cul-de-sac incorporating the uterosacral ligaments on either side and the intervening posterior peritoneum. The vaginal cuff was closed beginning with the angles which were created by the uterosacral ligament sutures that had been held being placed in the anterior vaginal cuff and the posterior vaginal cuff then being tied down behind the vaginal angle. The remainder of the vaginal cuff was closed with figure-of-eight sutures of 0 Vicryl incorporating the anterior vaginal mucosa anterior peritoneum posterior peritoneum and posterior vaginal mucosa and each suture. Hemostasis was noted to be adequate and the [facility] culdoplasty suture was tied down. The  vagina was  packed with a 2 inch packing which had been moistened with Estrace cream.  The surgeon changed gloves and the pneumoperitoneum was recreated. The laparoscope was replaced through the trocar sleeve and the site of operation was inspected and found to be hemostatic. The laparoscope and trocar were then removed from the peritoneal cavity as the CO2 was allowed to escape. The subumbilical incision was closed with Dermabond. The patient was then awakened from general anesthesia and taken to the recovery room in satisfactory condition having tolerated the procedure well wants sponge and instrument counts were correct. Specimens to pathology: Uterus and cervix Disposition: Patient to PACU in stable condition.

## 2010-10-30 NOTE — Anesthesia Procedure Notes (Signed)
Procedure Name: Intubation Date/Time: 10/30/2010 11:23 AM Performed by: Cephus Shelling Pre-anesthesia Checklist: Patient identified, Emergency Drugs available, Suction available and Patient being monitored Patient Re-evaluated:Patient Re-evaluated prior to inductionOxygen Delivery Method: Circle System Utilized Preoxygenation: Pre-oxygenation with 100% oxygen Intubation Type: IV induction Ventilation: Mask ventilation without difficulty Laryngoscope Size: Miller and 2 Grade View: Grade I Tube type: Oral Number of attempts: 1 Airway Equipment and Method: stylet Placement Confirmation: ETT inserted through vocal cords under direct vision,  positive ETCO2,  breath sounds checked- equal and bilateral and CO2 detector Secured at: 20 cm Tube secured with: Tape Dental Injury: Teeth and Oropharynx as per pre-operative assessment

## 2010-10-30 NOTE — Anesthesia Preprocedure Evaluation (Signed)
Anesthesia Evaluation  Name, MR# and DOB Patient awake  General Assessment Comment  Reviewed: Allergy & Precautions, H&P , Patient's Chart, lab work & pertinent test results, reviewed documented beta blocker date and time   Airway Mallampati: II TM Distance: >3 FB Neck ROM: full    Dental No notable dental hx.    Pulmonary  clear to auscultation  pulmonary exam normalPulmonary Exam Normal breath sounds clear to auscultation none    Cardiovascular regular Normal    Neuro/Psych   GI/Hepatic/Renal        GERD Controlled     Endo/Other    Abdominal   Musculoskeletal   Hematology   Peds  Reproductive/Obstetrics    Anesthesia Other Findings             Anesthesia Physical Anesthesia Plan  ASA: II  Anesthesia Plan: General   Post-op Pain Management:    Induction: Intravenous  Airway Management Planned: Oral ETT  Additional Equipment:   Intra-op Plan:   Post-operative Plan:   Informed Consent: I have reviewed the patients History and Physical, chart, labs and discussed the procedure including the risks, benefits and alternatives for the proposed anesthesia with the patient or authorized representative who has indicated his/her understanding and acceptance.   Dental Advisory Given  Plan Discussed with: CRNA and Surgeon  Anesthesia Plan Comments: (  Discussed  general anesthesia, including possible nausea, instrumentation of airway, sore throat,pulmonary aspiration, etc. I asked if the were any outstanding questions, or  concerns before we proceeded. )        Anesthesia Quick Evaluation

## 2010-10-30 NOTE — Anesthesia Postprocedure Evaluation (Signed)
Anesthesia Post Note  Patient: [name]  Procedure(s) Performed:  LAPAROSCOPY DIAGNOSTIC; HYSTERECTOMY VAGINAL  Anesthesia type: General  Patient location: PACU  Post pain: Pain level controlled  Post assessment: Post-op Vital signs reviewed  Last Vitals:  Filed Vitals:   10/30/10 1415  BP: 136/69  Pulse: 56  Temp:   Resp: 16    Post vital signs: Reviewed  Level of consciousness: sedated  Complications: No apparent anesthesia complications

## 2010-10-30 NOTE — Transfer of Care (Signed)
  Anesthesia Post-op Note  Patient: [name]  Procedure(s) Performed:  LAPAROSCOPY DIAGNOSTIC; HYSTERECTOMY VAGINAL  Patient Location: PACU  Anesthesia Type: General  Level of Consciousness: alert , oriented, sedated, patient cooperative and responds to stimulation  Airway and Oxygen Therapy: Patient Spontanous Breathing and Patient connected to nasal cannula oxygen  Post-op Pain: none  Post-op Assessment: Post-op Vital signs reviewed and Patient's Cardiovascular Status Stable  Post-op Vital Signs: Reviewed and stable  Complications: No apparent anesthesia complications

## 2010-10-31 ENCOUNTER — Encounter (HOSPITAL_COMMUNITY): Payer: Self-pay | Admitting: Obstetrics and Gynecology

## 2010-10-31 DIAGNOSIS — R102 Pelvic and perineal pain: Secondary | ICD-10-CM | POA: Diagnosis present

## 2010-10-31 DIAGNOSIS — N809 Endometriosis, unspecified: Secondary | ICD-10-CM | POA: Diagnosis present

## 2010-10-31 LAB — CBC
MCH: 30.8 pg (ref 26.0–34.0)
MCHC: 33 g/dL (ref 30.0–36.0)
MCV: 93.5 fL (ref 78.0–100.0)
Platelets: 221 10*3/uL (ref 150–400)
RBC: 3.21 MIL/uL — ABNORMAL LOW (ref 3.87–5.11)
RDW: 12.2 % (ref 11.5–15.5)

## 2010-10-31 MED ORDER — OXYCODONE-ACETAMINOPHEN 5-325 MG PO TABS: 1.0000 | ORAL_TABLET | ORAL | Status: AC | PRN

## 2010-10-31 MED ORDER — DOCUSATE SODIUM 50 MG PO CAPS: 100.0000 mg | ORAL_CAPSULE | ORAL | Status: AC

## 2010-10-31 MED ORDER — FERROUS SULFATE 325 (65 FE) MG PO TBEC: 325.0000 mg | DELAYED_RELEASE_TABLET | Freq: Every day | ORAL | Status: DC

## 2010-10-31 MED ORDER — IBUPROFEN 200 MG PO TABS: 800.0000 mg | ORAL_TABLET | Freq: Three times a day (TID) | ORAL | Status: AC

## 2010-10-31 NOTE — Progress Notes (Signed)
Encounter addended by: Randa Spike on: 10/31/2010  9:14 AM<BR>     Documentation filed: Notes Section

## 2010-10-31 NOTE — Anesthesia Postprocedure Evaluation (Signed)
  Anesthesia Post-op Note  Patient: [name]  Procedure(s) Performed:  LAPAROSCOPY DIAGNOSTIC; HYSTERECTOMY VAGINAL  Patient Location: PACU and [facility] Unit  Anesthesia Type: General  Level of Consciousness: awake, alert  and oriented  Airway and Oxygen Therapy: Patient Spontanous Breathing  Post-op Pain: mild  Post-op Assessment: Post-op Vital signs reviewed and Patient's Cardiovascular Status Stable  Post-op Vital Signs: Reviewed and stable  Complications: No apparent anesthesia complications

## 2010-10-31 NOTE — Progress Notes (Signed)
1 Day Post-Op Procedure(s) (LRB): LAPAROSCOPY DIAGNOSTIC (N/A) HYSTERECTOMY VAGINAL (N/A)  Subjective: Patient reports tolerating PO.    Objective: I have reviewed patient's vital signs, intake and output, medications and labs. Afebrile VSS Lungs clear Abd: soft positive bowel sounds Legs: non tender Hgb: 9.9 (10.9 pre-op) Wbc 13K  General: alert and cooperative  Assessment: s/p Procedure(s): LAPAROSCOPY DIAGNOSTIC HYSTERECTOMY VAGINAL: stable, progressing well and tolerating diet  Plan: Discharge home after patient voids  LOS: 1 day    HAYGOOD,VANESSA P 10/31/2010, 8:19 AM

## 2010-10-31 NOTE — Discharge Summary (Signed)
.  vh Physician Discharge Summary   Patient ID: [name] [id number] 35 y.o. 1975-07-28  Admit date: 10/30/2010  Discharge date and time: 10/31/10  Admitting Physician: Hal Morales, MD   Discharge Physician: same  Admission Diagnoses: Pelvic Pain, Endometriosis, Fibroids  Discharge Diagnoses: same                                         anemia  Admission Condition: stable  Discharged Condition: stable  Indication for Admission: Pelvic pain, endometriosis  Hospital Course: Admitted via OR where she underwent Laparoscopy and total vaginal hysterectomy.  Post op she had rapid return of GI and GU function as well as progression to oral pain medication.  Her post op HGB was 9.9 .  On post op day #1 she was ready for discharge home  Consults: none  Significant Diagnostic Studies: labs: Hgb 9.9   WBC 13,000  Treatments: surgery:   Discharge Exam: see progress note  Disposition: Home or Self Care  Patient Instructions:  Current Discharge Medication List Percocet 5/325 one or two orally every 4 hours as needed for severe pain Ibuprofen 800mg  orally ever 8 hours for 5 days then every 8 hours as needed for pain Iron sulfate 325 mg orally each day for 6 weeks Colase 100mg  orally daily as needed for constipation    CONTINUE these medications which have NOT CHANGED   Details  CYANOCOBALAMIN PO Take 1 tablet by mouth daily.           Multiple Vitamins-Minerals (MULTIVITAMIN WITH MINERALS) tablet Take 1 tablet by mouth daily.      omeprazole (PRILOSEC) 20 MG capsule Take 20 mg by mouth daily.         Activity: activity as tolerated, no sex for 6 weeks and no driving while on analgesics Diet: regular diet Wound Care: keep wound clean and dry  Follow-up with Dr. Pennie Rushing in 6 weeks.  SignedDierdre Forth P 10/31/2010 8:32 AM

## 2010-11-12 ENCOUNTER — Encounter (HOSPITAL_COMMUNITY): Payer: Self-pay | Admitting: Obstetrics and Gynecology

## 2010-12-06 ENCOUNTER — Ambulatory Visit
Admission: RE | Admit: 2010-12-06 | Discharge: 2010-12-06 | Disposition: A | Discharge status: Home / Self Care | Payer: BC Managed Care – PPO | Source: Ambulatory Visit | Attending: Family Medicine | Admitting: Family Medicine

## 2010-12-06 ENCOUNTER — Other Ambulatory Visit: Payer: Self-pay | Admitting: Family Medicine

## 2010-12-06 DIAGNOSIS — R52 Pain, unspecified: Secondary | ICD-10-CM

## 2010-12-06 DIAGNOSIS — R609 Edema, unspecified: Secondary | ICD-10-CM

## 2011-04-07 IMAGING — CR DG CHEST 2V
2 series · 2 of 2 positions shown · non-contrast
Comparison: None.

CLINICAL DATA: Chest pain for several months

CHEST - 2 VIEW

[w chest pa]
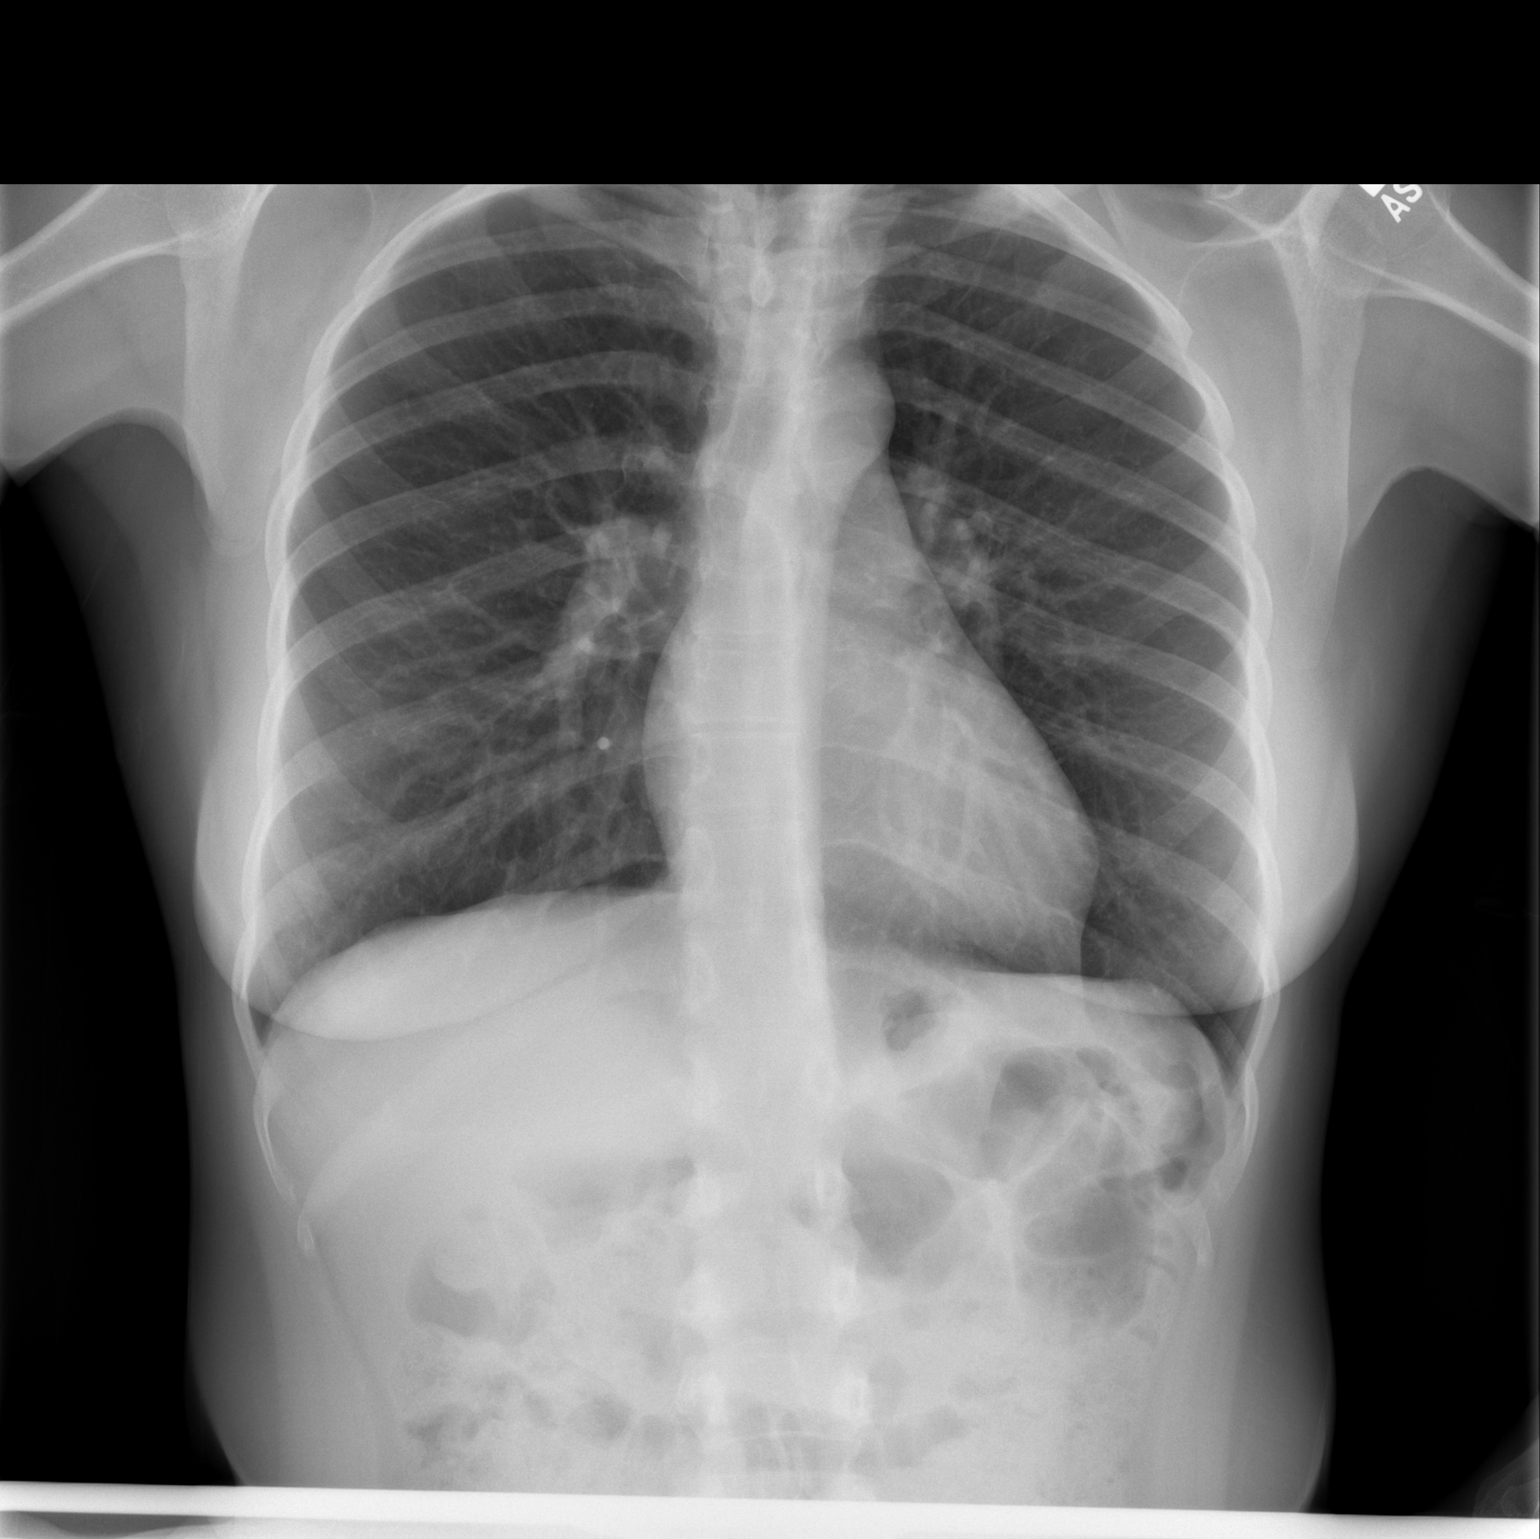

[w chest lat]
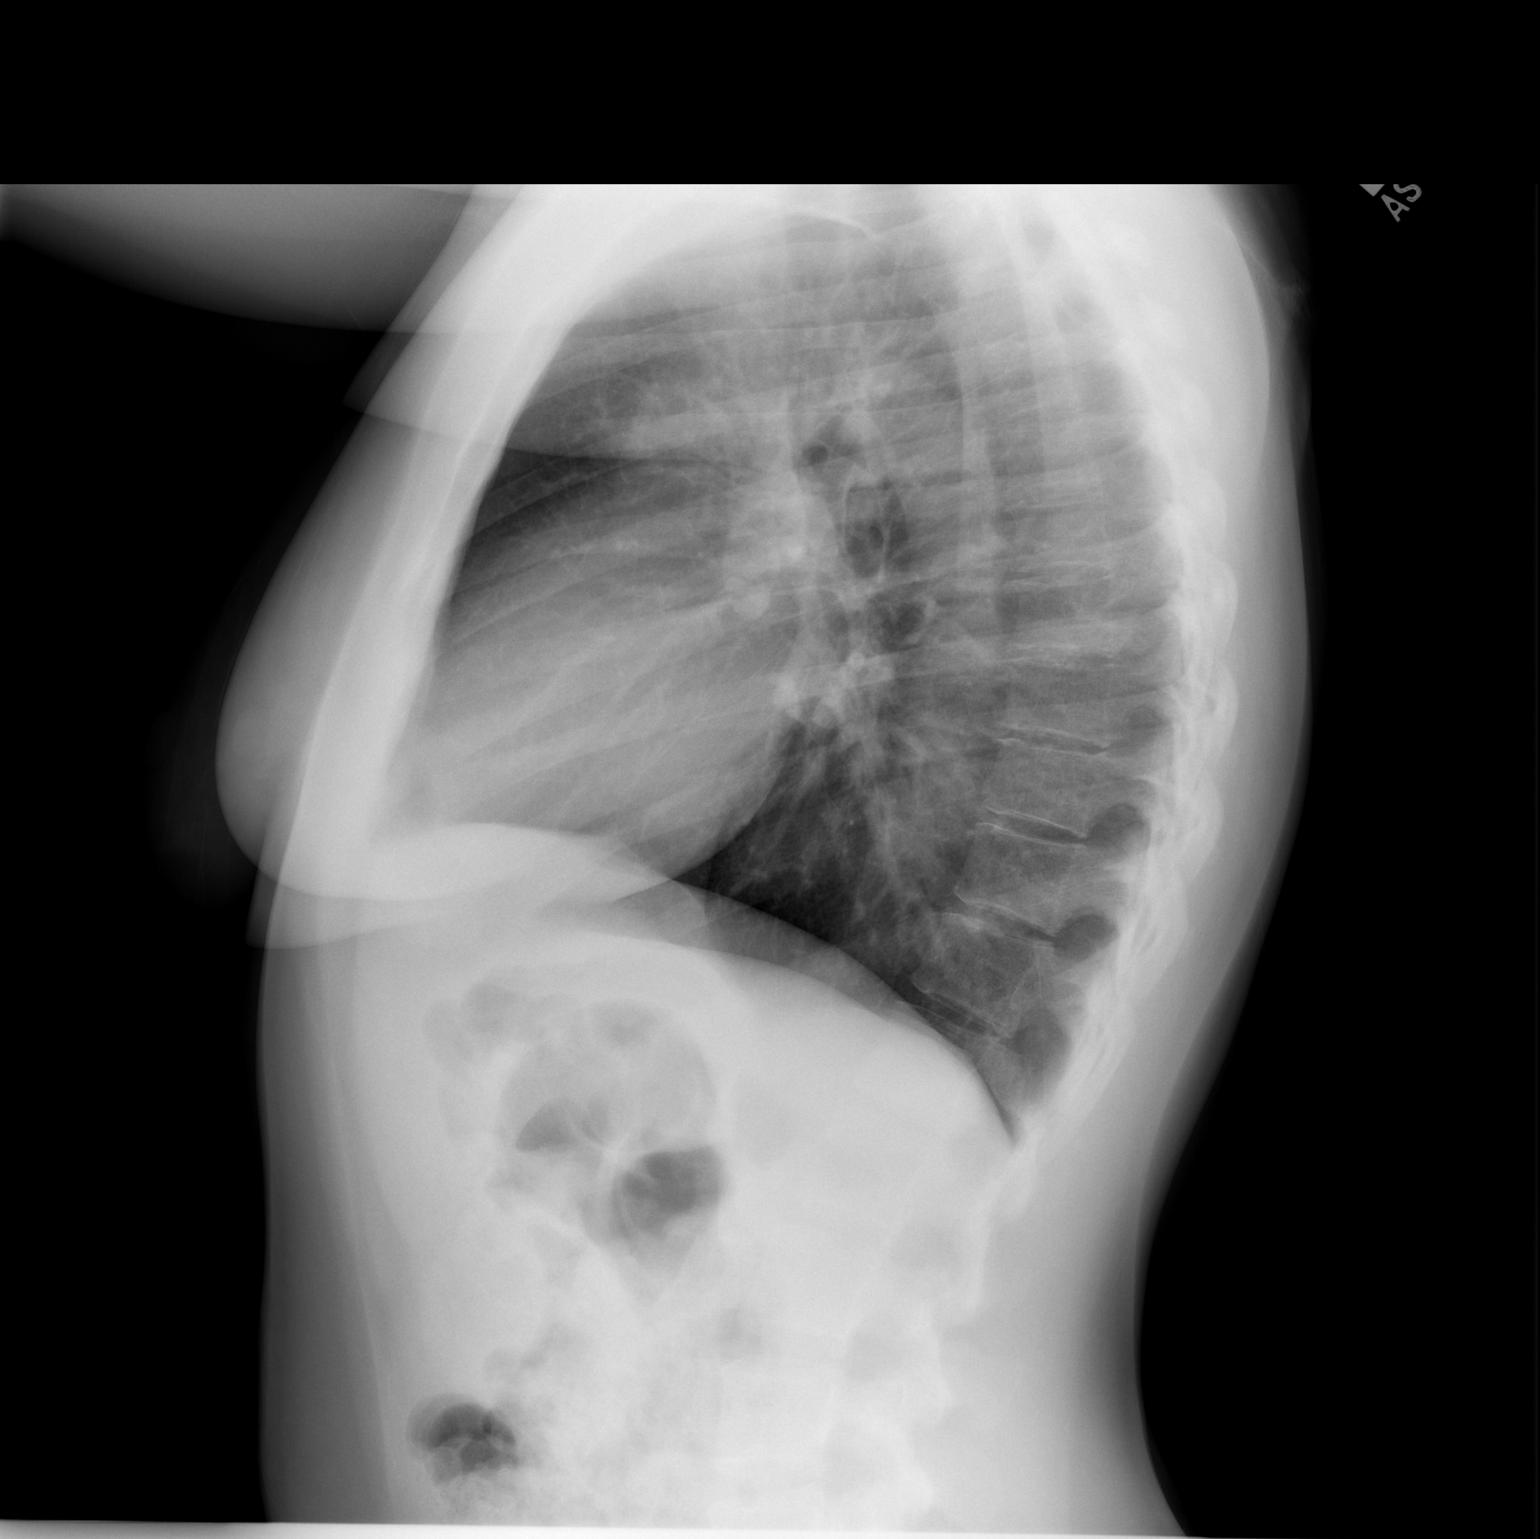

[2 of 2 positions shown; findings below may reference images not displayed]

FINDINGS: The lungs are normally expanded and clear.  There is no
pleural effusion or evidence of  lymphadenopathy.  The heart,
mediastinal, and hilar contours are normal.  The bones appear
normal.
IMPRESSION: No acute cardiopulmonary disease

## 2011-06-11 ENCOUNTER — Ambulatory Visit (INDEPENDENT_AMBULATORY_CARE_PROVIDER_SITE_OTHER): Payer: BC Managed Care – PPO | Admitting: Obstetrics and Gynecology

## 2011-06-11 ENCOUNTER — Encounter: Payer: Self-pay | Admitting: Obstetrics and Gynecology

## 2011-06-11 VITALS — BP 122/80 | Ht 65.0 in | Wt 154.0 lb

## 2011-06-11 DIAGNOSIS — N898 Other specified noninflammatory disorders of vagina: Secondary | ICD-10-CM

## 2011-06-11 DIAGNOSIS — R350 Frequency of micturition: Secondary | ICD-10-CM

## 2011-06-11 DIAGNOSIS — Z711 Person with feared health complaint in whom no diagnosis is made: Secondary | ICD-10-CM

## 2011-06-11 DIAGNOSIS — B379 Candidiasis, unspecified: Secondary | ICD-10-CM

## 2011-06-11 LAB — POCT URINALYSIS DIPSTICK
Bilirubin, UA: NEGATIVE
Glucose, UA: NEGATIVE
Nitrite, UA: NEGATIVE
Urobilinogen, UA: NEGATIVE

## 2011-06-11 LAB — POCT WET PREP (WET MOUNT): pH: 4.5

## 2011-06-11 MED ORDER — TERCONAZOLE 0.4 % VA CREA: 1.0000 | TOPICAL_CREAM | Freq: Every day | VAGINAL | Status: AC

## 2011-06-11 MED ORDER — FLUCONAZOLE 100 MG PO TABS: 100.0000 mg | ORAL_TABLET | Freq: Every day | ORAL | Status: AC

## 2011-06-11 NOTE — Patient Instructions (Signed)
Candida Infection, Adult A candida infection (also called yeast, fungus and Monilia infection) is an overgrowth of yeast that can occur anywhere on the body. A yeast infection commonly occurs in warm, moist body areas. Usually, the infection remains localized but can spread to become a systemic infection. A yeast infection may be a sign of a more severe disease such as diabetes, leukemia, or AIDS. A yeast infection can occur in both men and women. In women, Candida vaginitis is a vaginal infection. It is one of the most common causes of vaginitis. Men usually do not have symptoms or know they have an infection until other problems develop. Men may find out they have a yeast infection because their sex partner has a yeast infection. Uncircumcised men are more likely to get a yeast infection than circumcised men. This is because the uncircumcised glans is not exposed to air and does not remain as dry as that of a circumcised glans. Older adults may develop yeast infections around dentures. CAUSES  Women  Antibiotics.   Steroid medication taken for a long time.   Being overweight (obese).   Diabetes.   Poor immune condition.   Certain serious medical conditions.   Immune suppressive medications for organ transplant patients.   Chemotherapy.   Pregnancy.   Menstration.   Stress and fatigue.   Intravenous drug use.   Oral contraceptives.   Wearing tight-fitting clothes in the crotch area.   Catching it from a sex partner who has a yeast infection.   Spermicide.   Intravenous, urinary, or other catheters.  Men  Catching it from a sex partner who has a yeast infection.   Having oral or anal sex with a person who has the infection.   Spermicide.   Diabetes.   Antibiotics.   Poor immune system.   Medications that suppress the immune system.   Intravenous drug use.   Intravenous, urinary, or other catheters.  SYMPTOMS  Women  Thick, white vaginal discharge.    Vaginal itching.   Redness and swelling in and around the vagina.   Irritation of the lips of the vagina and perineum.   Blisters on the vaginal lips and perineum.   Painful sexual intercourse.   Low blood sugar (hypoglycemia).   Painful urination.   Bladder infections.   Intestinal problems such as constipation, indigestion, bad breath, bloating, increase in gas, diarrhea, or loose stools.  Men  Men may develop intestinal problems such as constipation, indigestion, bad breath, bloating, increase in gas, diarrhea, or loose stools.   Dry, cracked skin on the penis with itching or discomfort.   Jock itch.   Dry, flaky skin.   Athlete's foot.   Hypoglycemia.  DIAGNOSIS  Women  A history and an exam are performed.   The discharge may be examined under a microscope.   A culture may be taken of the discharge.  Men  A history and an exam are performed.   Any discharge from the penis or areas of cracked skin will be looked at under the microscope and cultured.   Stool samples may be cultured.  TREATMENT  Women  Vaginal antifungal suppositories and creams.   Medicated creams to decrease irritation and itching on the outside of the vagina.   Warm compresses to the perineal area to decrease swelling and discomfort.   Oral antifungal medications.   Medicated vaginal suppositories or cream for repeated or recurrent infections.   Wash and dry the irritation areas before applying the cream.     Eating yogurt with lactobacillus may help with prevention and treatment.   Sometimes painting the vagina with gentian violet solution may help if creams and suppositories do not work.  Men  Antifungal creams and oral antifungal medications.   Sometimes treatment must continue for 30 days after the symptoms go away to prevent recurrence.  HOME CARE INSTRUCTIONS  Women  Use cotton underwear and avoid tight-fitting clothing.   Avoid colored, scented toilet paper and  deodorant tampons or pads.   Do not douche.   Keep your diabetes under control.   Finish all the prescribed medications.   Keep your skin clean and dry.   Consume milk or yogurt with lactobacillus active culture regularly. If you get frequent yeast infections and think that is what the infection is, there are over-the-counter medications that you can get. If the infection does not show healing in 3 days, talk to your caregiver.   Tell your sex partner you have a yeast infection. Your partner may need treatment also, especially if your infection does not clear up or recurs.  Men  Keep your skin clean and dry.   Keep your diabetes under control.   Finish all prescribed medications.   Tell your sex partner that you have a yeast infection so they can be treated if necessary.  SEEK MEDICAL CARE IF:   Your symptoms do not clear up or worsen in one week after treatment.   You have an oral temperature above 102 F (38.9 C).   You have trouble swallowing or eating for a prolonged time.   You develop blisters on and around your vagina.   You develop vaginal bleeding and it is not your menstrual period.   You develop abdominal pain.   You develop intestinal problems as mentioned above.   You get weak or lightheaded.   You have painful or increased urination.   You have pain during sexual intercourse.  MAKE SURE YOU:   Understand these instructions.   Will watch your condition.   Will get help right away if you are not doing well or get worse.  Document Released: 03/27/2004 Document Revised: 02/06/2011 Document Reviewed: 07/09/2009 [employer] Patient Information 2012 [employer]. 

## 2011-06-11 NOTE — Progress Notes (Signed)
Subjective:    [name] is a 36 y.o. female who presents for an annual exam. The patient reports:intermittent vaginal discharge with itching and urinary frequency.   The patient is sexually active.. The patient wears seatbelts: yes.The patient reports2 alcohol containing beverages per week. She participates in regular exercise: {: recently started.The patient reports that there is not domestic violence in her life.  Last mammogram: not applicable  Last pap: s/p hyst with normal cervical pathology   GC/Chlamydia cultures offered: requested on urine HIV/RPR/HbsAg offered:  Requests HIV only HSV 1 and 2 glycoprotein offered: requested  Menstrual cycle:   LMP: Patient's last menstrual period was 09/30/2010.           s/p hysterectomy  Menstrual History: OB History    Grav Para Term Preterm Abortions TAB SAB Ect Mult Living   3 2 2  0 1 0 0 1 0 2      Menarche age: 39 Patient's last menstrual period was 09/30/2010.    The following portions of the patient's history were reviewed and updated as appropriate: allergies, current medications, past medical history, past surgical history and problem list.  Review of Systems Intermittent back pain since her surgery Breast:Negative for breast lump,nipple discharge or nipple retraction Gastrointestinal: Negative for abdominal pain, change in bowel habits or rectal bleeding Urinary:negative for neg    Objective:    BP 122/80  Ht 5\' 5"  (1.651 m)  Wt 154 lb (69.854 kg)  BMI 25.63 kg/m2  LMP 09/30/2010 Weight:  Wt Readings from Last 1 Encounters:  06/11/11 154 lb (69.854 kg)   BMI: Body mass index is 25.63 kg/(m^2). General Appearance: Alert, appropriate appearance for age. No acute distress HEENT: Grossly normal Neck / Thyroid: Supple, no masses, nodes or enlargement Lungs: clear to auscultation bilaterally Back: No CVA tenderness Breast Exam: No masses or nodes.No dimpling, nipple retraction or discharge. Cardiovascular:  Regular rate and rhythm. S1, S2, no murmur Gastrointestinal: Soft, non-tender, no masses or organomegaly Pelvic Exam: External genitalia: normal general appearance.  Vagina rougous.  Cervix and uterus surgically absent Rectovaginal: normal rectal, no masses Lymphatic Exam: Non-palpable nodes in neck, clavicular, axillary, or inguinal regions Skin: no rash or abnormalities Neurologic: Normal gait and speech, no tremor  Psychiatric: Alert and oriented, appropriate affect.   Wet Prep:positive hyphae Urinalysis:not applicable UPT: Not done   Assessment:    Monilia    Plan:    return annually or prn Terazol STD screening: done Contraception:s/p hyst      [name] PMD

## 2011-06-15 DIAGNOSIS — B379 Candidiasis, unspecified: Secondary | ICD-10-CM | POA: Insufficient documentation

## 2011-06-15 DIAGNOSIS — R350 Frequency of micturition: Secondary | ICD-10-CM | POA: Insufficient documentation

## 2011-06-15 DIAGNOSIS — Z711 Person with feared health complaint in whom no diagnosis is made: Secondary | ICD-10-CM | POA: Insufficient documentation

## 2012-05-19 ENCOUNTER — Other Ambulatory Visit: Payer: Self-pay | Admitting: Family Medicine

## 2012-05-28 ENCOUNTER — Ambulatory Visit
Admission: RE | Admit: 2012-05-28 | Discharge: 2012-05-28 | Disposition: A | Discharge status: Home / Self Care | Payer: BC Managed Care – PPO | Source: Ambulatory Visit | Attending: Family Medicine | Admitting: Family Medicine

## 2012-05-28 DIAGNOSIS — N63 Unspecified lump in unspecified breast: Secondary | ICD-10-CM

## 2012-11-19 ENCOUNTER — Other Ambulatory Visit: Payer: Self-pay | Admitting: Family Medicine

## 2012-11-19 DIAGNOSIS — N6310 Unspecified lump in the right breast, unspecified quadrant: Secondary | ICD-10-CM

## 2012-11-25 ENCOUNTER — Ambulatory Visit
Admission: RE | Admit: 2012-11-25 | Discharge: 2012-11-25 | Disposition: A | Discharge status: Home / Self Care | Payer: BC Managed Care – PPO | Source: Ambulatory Visit | Attending: Family Medicine | Admitting: Family Medicine

## 2012-11-25 DIAGNOSIS — N6310 Unspecified lump in the right breast, unspecified quadrant: Secondary | ICD-10-CM

## 2013-02-11 ENCOUNTER — Other Ambulatory Visit: Payer: Self-pay | Admitting: Family Medicine

## 2013-02-11 ENCOUNTER — Other Ambulatory Visit (HOSPITAL_COMMUNITY)
Admission: RE | Admit: 2013-02-11 | Discharge: 2013-02-11 | Disposition: A | Discharge status: Home / Self Care | Payer: BC Managed Care – PPO | Source: Ambulatory Visit | Attending: Family Medicine | Admitting: Family Medicine

## 2013-02-11 DIAGNOSIS — Z1151 Encounter for screening for human papillomavirus (HPV): Secondary | ICD-10-CM | POA: Insufficient documentation

## 2013-02-11 DIAGNOSIS — Z124 Encounter for screening for malignant neoplasm of cervix: Secondary | ICD-10-CM | POA: Insufficient documentation

## 2014-01-02 ENCOUNTER — Encounter: Payer: Self-pay | Admitting: Obstetrics and Gynecology

## 2014-01-10 ENCOUNTER — Other Ambulatory Visit: Payer: Self-pay

## 2014-01-10 DIAGNOSIS — Z1231 Encounter for screening mammogram for malignant neoplasm of breast: Secondary | ICD-10-CM

## 2014-02-20 ENCOUNTER — Ambulatory Visit
Admission: RE | Admit: 2014-02-20 | Discharge: 2014-02-20 | Disposition: A | Discharge status: Home / Self Care | Payer: Managed Care, Other (non HMO) | Source: Ambulatory Visit

## 2014-02-20 DIAGNOSIS — Z1231 Encounter for screening mammogram for malignant neoplasm of breast: Secondary | ICD-10-CM

## 2016-01-25 ENCOUNTER — Encounter (HOSPITAL_COMMUNITY): Payer: Self-pay | Admitting: Emergency Medicine

## 2016-01-25 ENCOUNTER — Emergency Department (HOSPITAL_COMMUNITY)
Admission: EM | Admit: 2016-01-25 | Discharge: 2016-01-25 | Disposition: A | Discharge status: Home / Self Care | Payer: BLUE CROSS/BLUE SHIELD | Attending: Emergency Medicine | Admitting: Emergency Medicine

## 2016-01-25 DIAGNOSIS — J029 Acute pharyngitis, unspecified: Secondary | ICD-10-CM | POA: Diagnosis present

## 2016-01-25 DIAGNOSIS — J020 Streptococcal pharyngitis: Secondary | ICD-10-CM | POA: Diagnosis not present

## 2016-01-25 LAB — RAPID STREP SCREEN (MED CTR MEBANE ONLY): Streptococcus, Group A Screen (Direct): POSITIVE — AB

## 2016-01-25 MED ORDER — DEXAMETHASONE 4 MG PO TABS
10.0000 mg | ORAL_TABLET | Freq: Once | ORAL | Status: AC
  Administered 2016-01-25: 16:00:00 10 mg via ORAL
  Filled 2016-01-25: qty 2

## 2016-01-25 MED ORDER — PENICILLIN G BENZATHINE 1200000 UNIT/2ML IM SUSP
1.2000 10*6.[IU] | Freq: Once | INTRAMUSCULAR | Status: AC
  Administered 2016-01-25: 1.2 10*6.[IU] via INTRAMUSCULAR
  Filled 2016-01-25: qty 2

## 2016-01-25 MED ORDER — PENICILLIN G BENZATHINE & PROC 1200000 UNIT/2ML IM SUSP: 1.2000 10*6.[IU] | Freq: Once | INTRAMUSCULAR | Status: DC

## 2016-01-25 NOTE — Discharge Instructions (Signed)
Please read attached information. If you experience any new or worsening signs or symptoms please return to the emergency room for evaluation. Please follow-up with your primary care provider or specialist as discussed.  °

## 2016-01-25 NOTE — ED Triage Notes (Signed)
Pt complaint of nonproductive cough, congestion, facial pressure, and generalized aches since Tuesday.

## 2016-01-25 NOTE — ED Notes (Signed)
ED Provider at bedside. 

## 2016-01-25 NOTE — ED Provider Notes (Signed)
[facility] DEPT Provider Note   CSN: [id number] Arrival date & time: 01/25/16  1400  By signing my name below, I, Soijett Blue, attest that this documentation has been prepared under the direction and in the presence of Lenn Sink, PA-C Electronically Signed: Soijett Blue, ED Scribe. 01/25/16. 3:33 PM.   History   Chief Complaint Chief Complaint  Patient presents with  . Nasal Congestion  . Generalized Body Aches    HPI [name] is a 40 y.o. female who presents to the Emergency Department complaining of sore throat onset 3 days ago. Pt denies sick contacts with similar symptoms. She states that she is having associated symptoms of nasal congestion, voice change, generalized body aches localized to leg, bilateral ear pain, painful swallowing, mild cough, HA, and swollen tonsils. She states that she has tried aleve with her last dose being this morning with no relief for her symptoms. She denies fever, chills, trouble swallowing, drooling, and any other symptoms. Pt reports that she works in [employer].    The history is provided by the patient. No language interpreter was used.    Past Medical History:  Diagnosis Date  . Bladder infection 05/13/2010  . Blood dyscrasia    takes B12  . Chicken pox 05/13/2010  . Endometriosis   . Fibroids 10/10/2010  . GERD (gastroesophageal reflux disease)    otc Prilosec when needed  . Hx of gonorrhea   . Irritable bowel 05/13/2010  . Lichen sclerosus et atrophicus of the vulva 10/10/2010  . Low iron    During pregnancy  . Migraines 05/13/2010  . Pelvic pain    s/p hysterectomy  . PONV (postoperative nausea and vomiting)   . Yeast infection     Patient Active Problem List   Diagnosis Date Noted  . 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 Concern about STD in female without diagnosis 06/15/2011  . Urinary frequency 06/15/2011  . Endometriosis 10/31/2010    Past Surgical History:  Procedure Laterality Date  .  APPENDECTOMY    . FRACTURE SURGERY  1988   left leg  . LAPAROSCOPIC OVARIAN CYSTECTOMY  06/2010  . LAPAROSCOPY  2012  . LAPAROSCOPY  10/30/2010   Procedure: LAPAROSCOPY DIAGNOSTIC;  Surgeon: Eldred Manges, MD;  Location: [facility];  Service: Gynecology;  Laterality: N/A;  . LAPAROSCOPY FOR ECTOPIC PREGNANCY    . TUBAL LIGATION  2012   bilateral  . VAGINAL HYSTERECTOMY  10/30/2010   Procedure: HYSTERECTOMY VAGINAL;  Surgeon: Eldred Manges, MD;  Location: [facility];  Service: Gynecology;  Laterality: N/A;    OB History    Gravida Para Term Preterm AB Living   3 2 2  0 1 2   SAB TAB Ectopic Multiple Live Births   0 0 1 0         Home Medications    Prior to Admission medications   Medication Sig Start Date End Date Taking? Authorizing Provider  CYANOCOBALAMIN PO Take 1 tablet by mouth daily.     Historical Provider, MD  ferrous sulfate 325 (65 FE) MG EC tablet Take 1 tablet (325 mg total) by mouth daily with breakfast. 10/31/10 10/31/11  Eldred Manges, MD  Multiple Vitamins-Minerals (MULTIVITAMIN WITH MINERALS) tablet Take 1 tablet by mouth daily.      Historical Provider, MD  omeprazole (PRILOSEC) 20 MG capsule Take 20 mg by mouth daily.      Historical Provider, MD    Family History No family history on file.  Social History Social History  Substance Use Topics  . Smoking status: Never Smoker  . Smokeless tobacco: Never Used  . Alcohol use No     Comment: rarely     Allergies   Patient has no known allergies.   Review of Systems Review of Systems  Constitutional: Negative for chills and fever.  HENT: Positive for congestion, ear pain (bilateral), sore throat and voice change (hoarse). Negative for drooling and trouble swallowing.        +Painful swallowing and swollen tonsils  Respiratory: Positive for cough.      Physical Exam Updated Vital Signs BP 148/91 (BP Location: Left Arm)   Pulse 88   Temp 97.8 F (36.6 C) (Oral)   Resp 18   LMP 09/30/2010    SpO2 100%   Physical Exam  Constitutional: She is oriented to person, place, and time. She appears well-developed and well-nourished. No distress.  HENT:  Head: Normocephalic and atraumatic.  Right Ear: Tympanic membrane, external ear and ear canal normal.  Left Ear: Tympanic membrane, external ear and ear canal normal.  Mouth/Throat: Uvula is midline, oropharynx is clear and moist and mucous membranes are normal. No oropharyngeal exudate.  Bilateral tonsillar swelling and equal. Tonsils are kissing. Uvula midline. Tonsilliths noted. No exudate. No pooling of secretions.  Eyes: EOM are normal.  Neck: Trachea normal. Neck supple. No tracheal deviation present.  Trachea midline. Neck supple.   Cardiovascular: Normal rate, regular rhythm and normal heart sounds.  Exam reveals no gallop and no friction rub.   No murmur heard. Pulmonary/Chest: Effort normal and breath sounds normal. No respiratory distress. She has no wheezes. She has no rales.  Abdominal: She exhibits no distension.  Musculoskeletal: Normal range of motion.  Neurological: She is alert and oriented to person, place, and time.  Skin: Skin is warm and dry.  Psychiatric: She has a normal mood and affect. Her behavior is normal.  Nursing note and vitals reviewed.    ED Treatments / Results  DIAGNOSTIC STUDIES: Oxygen Saturation is 99% on RA, nl by my interpretation.    COORDINATION OF CARE: 3:22 PM Discussed treatment plan with pt at bedside which includes rapid strep screen, decadron injection and pt agreed to plan.   Labs (all labs ordered are listed, but only abnormal results are displayed) Labs Reviewed  RAPID STREP SCREEN (NOT AT [facility]) - Abnormal; Notable for the following:       Result Value   Streptococcus, Group A Screen (Direct) POSITIVE (*)    All other components within normal limits    Procedures Procedures (including critical care time)  Medications Ordered in ED Medications  dexamethasone  (DECADRON) tablet 10 mg (10 mg Oral Given 01/25/16 1628)  penicillin g benzathine (BICILLIN LA) 1200000 UNIT/2ML injection 1.2 Million Units (1.2 Million Units Intramuscular Given 01/25/16 1722)     Initial Impression / Assessment and Plan / ED Course  I have reviewed the triage vital signs and the nursing notes.  Pertinent labs that were available during my care of the patient were reviewed by me and considered in my medical decision making (see chart for details).  Clinical Course      Labs: Rapid strep screen: positive  Imaging:   Consults:   Therapeutics: decadron injection and bicillin LA injection  Discharge Meds:  Assessment/Plan: Pt rapid strep test positive. Pt is tolerating secretions. Presentation not concerning for peritonsillar abscess or spread of infection to deep spaces of the throat; patent airway. Pt will be treated  with bicillin LA injection while in the ED. Specific return precautions discussed. Recommended PCP follow up. Pt appears safe for discharge.    Final Clinical Impressions(s) / ED Diagnoses   Final diagnoses:  Strep pharyngitis    New Prescriptions Discharge Medication List as of 01/25/2016  5:08 PM     I personally performed the services described in this documentation, which was scribed in my presence. The recorded information has been reviewed and is accurate.    Okey Regal, PA-C 01/25/16 [id number]    Charlesetta Shanks, MD 01/28/16 [phone]

## 2016-05-20 ENCOUNTER — Other Ambulatory Visit: Payer: Self-pay | Admitting: Family Medicine

## 2016-05-20 DIAGNOSIS — Z1231 Encounter for screening mammogram for malignant neoplasm of breast: Secondary | ICD-10-CM

## 2016-06-16 ENCOUNTER — Ambulatory Visit
Admission: RE | Admit: 2016-06-16 | Discharge: 2016-06-16 | Disposition: A | Discharge status: Home / Self Care | Payer: BLUE CROSS/BLUE SHIELD | Source: Ambulatory Visit | Attending: Family Medicine | Admitting: Family Medicine

## 2016-06-16 DIAGNOSIS — Z1231 Encounter for screening mammogram for malignant neoplasm of breast: Secondary | ICD-10-CM

## 2017-07-22 ENCOUNTER — Other Ambulatory Visit: Payer: Self-pay | Admitting: Family Medicine

## 2017-07-22 DIAGNOSIS — Z1231 Encounter for screening mammogram for malignant neoplasm of breast: Secondary | ICD-10-CM

## 2017-08-17 ENCOUNTER — Ambulatory Visit
Admission: RE | Admit: 2017-08-17 | Discharge: 2017-08-17 | Disposition: A | Discharge status: Home / Self Care | Payer: 59 | Source: Ambulatory Visit | Attending: Family Medicine | Admitting: Family Medicine

## 2017-08-17 DIAGNOSIS — Z1231 Encounter for screening mammogram for malignant neoplasm of breast: Secondary | ICD-10-CM

## 2018-02-12 ENCOUNTER — Other Ambulatory Visit: Payer: Self-pay | Admitting: Family Medicine

## 2018-02-12 DIAGNOSIS — R2232 Localized swelling, mass and lump, left upper limb: Secondary | ICD-10-CM

## 2018-02-16 ENCOUNTER — Ambulatory Visit
Admission: RE | Admit: 2018-02-16 | Discharge: 2018-02-16 | Disposition: A | Discharge status: Home / Self Care | Payer: 59 | Source: Ambulatory Visit | Attending: Family Medicine | Admitting: Family Medicine

## 2018-02-16 ENCOUNTER — Other Ambulatory Visit: Payer: Self-pay | Admitting: Family Medicine

## 2018-02-16 DIAGNOSIS — R2232 Localized swelling, mass and lump, left upper limb: Secondary | ICD-10-CM

## 2018-11-19 ENCOUNTER — Other Ambulatory Visit: Payer: Self-pay | Admitting: Family Medicine

## 2018-11-19 DIAGNOSIS — Z1231 Encounter for screening mammogram for malignant neoplasm of breast: Secondary | ICD-10-CM

## 2018-12-14 ENCOUNTER — Other Ambulatory Visit: Payer: Self-pay

## 2018-12-14 ENCOUNTER — Ambulatory Visit
Admission: RE | Admit: 2018-12-14 | Discharge: 2018-12-14 | Disposition: A | Discharge status: Home / Self Care | Payer: 59 | Source: Ambulatory Visit | Attending: Family Medicine | Admitting: Family Medicine

## 2018-12-14 DIAGNOSIS — Z1231 Encounter for screening mammogram for malignant neoplasm of breast: Secondary | ICD-10-CM

## 2019-01-06 ENCOUNTER — Encounter (HOSPITAL_BASED_OUTPATIENT_CLINIC_OR_DEPARTMENT_OTHER): Payer: Self-pay

## 2019-01-06 ENCOUNTER — Emergency Department (HOSPITAL_BASED_OUTPATIENT_CLINIC_OR_DEPARTMENT_OTHER)
Admission: EM | Admit: 2019-01-06 | Discharge: 2019-01-06 | Disposition: A | Discharge status: Home / Self Care | Payer: 59 | Attending: Emergency Medicine | Admitting: Emergency Medicine

## 2019-01-06 ENCOUNTER — Emergency Department (HOSPITAL_BASED_OUTPATIENT_CLINIC_OR_DEPARTMENT_OTHER): Payer: 59

## 2019-01-06 ENCOUNTER — Other Ambulatory Visit: Payer: Self-pay

## 2019-01-06 DIAGNOSIS — Z79899 Other long term (current) drug therapy: Secondary | ICD-10-CM | POA: Insufficient documentation

## 2019-01-06 DIAGNOSIS — U071 COVID-19: Secondary | ICD-10-CM | POA: Diagnosis not present

## 2019-01-06 DIAGNOSIS — J069 Acute upper respiratory infection, unspecified: Secondary | ICD-10-CM | POA: Diagnosis not present

## 2019-01-06 DIAGNOSIS — R05 Cough: Secondary | ICD-10-CM | POA: Diagnosis present

## 2019-01-06 LAB — SARS CORONAVIRUS 2 (TAT 6-24 HRS): SARS Coronavirus 2: POSITIVE — AB

## 2019-01-06 LAB — PREGNANCY, URINE: Preg Test, Ur: NEGATIVE

## 2019-01-06 MED ORDER — AZITHROMYCIN 250 MG PO TABS: 250.0000 mg | ORAL_TABLET | Freq: Every day | ORAL | 0 refills | Status: DC

## 2019-01-06 MED ORDER — BENZONATATE 100 MG PO CAPS: 100.0000 mg | ORAL_CAPSULE | Freq: Three times a day (TID) | ORAL | 0 refills | Status: DC

## 2019-01-06 MED ORDER — ACETAMINOPHEN 500 MG PO TABS
1000.0000 mg | ORAL_TABLET | Freq: Once | ORAL | Status: AC
  Administered 2019-01-06: 17:00:00 1000 mg via ORAL
  Filled 2019-01-06: qty 2

## 2019-01-06 NOTE — ED Triage Notes (Signed)
Pt c/o flu like sx x 4 days-NAD-steady gait

## 2019-01-06 NOTE — ED Provider Notes (Signed)
[facility] EMERGENCY DEPARTMENT Provider Note   CSN: [id number] Arrival date & time: 01/06/19  1416     History   Chief Complaint Chief Complaint  Patient presents with  . Cough    HPI [name] is a 43 y.o. female who presents for evaluation of cough, generalized body aches, decreased appetite x4 days.  She has not noted any fever at home.  She reports cough is dry.  She reports some trouble breathing and chest soreness when she gets into a coughing fit but otherwise denies any chest pain, trouble breathing.  She has not had any nausea/vomiting but states she has not felt like eating.  She has no history of asthma or COPD.  Denies any recent travel or known COVID-19 exposure.     The history is provided by the patient.    Past Medical History:  Diagnosis Date  . Bladder infection 05/13/2010  . Blood dyscrasia    takes B12  . Chicken pox 05/13/2010  . Endometriosis   . Fibroids 10/10/2010  . GERD (gastroesophageal reflux disease)    otc Prilosec when needed  . Hx of gonorrhea   . Irritable bowel 05/13/2010  . Lichen sclerosus et atrophicus of the vulva 10/10/2010  . Low iron    During pregnancy  . Migraines 05/13/2010  . Pelvic pain    s/p hysterectomy  . PONV (postoperative nausea and vomiting)   . Yeast infection     Patient Active Problem List   Diagnosis Date Noted  . 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 Concern about STD in female without diagnosis 06/15/2011  . Urinary frequency 06/15/2011  . Endometriosis 10/31/2010    Past Surgical History:  Procedure Laterality Date  . ABDOMINAL HYSTERECTOMY    . APPENDECTOMY    . FRACTURE SURGERY  1988   left leg  . LAPAROSCOPIC OVARIAN CYSTECTOMY  06/2010  . LAPAROSCOPY  2012  . LAPAROSCOPY  10/30/2010   Procedure: LAPAROSCOPY DIAGNOSTIC;  Surgeon: Eldred Manges, MD;  Location: [facility];  Service: Gynecology;  Laterality: N/A;  . LAPAROSCOPY FOR ECTOPIC PREGNANCY    . TUBAL LIGATION  2012   bilateral  .  VAGINAL HYSTERECTOMY  10/30/2010   Procedure: HYSTERECTOMY VAGINAL;  Surgeon: Eldred Manges, MD;  Location: [facility];  Service: Gynecology;  Laterality: N/A;     OB History    Gravida  3   Para  2   Term  2   Preterm  0   AB  1   Living  2     SAB  0   TAB  0   Ectopic  1   Multiple  0   Live Births               Home Medications    Prior to Admission medications   Medication Sig Start Date End Date Taking? Authorizing Provider  azithromycin (ZITHROMAX) 250 MG tablet Take 1 tablet (250 mg total) by mouth daily. Take first 2 tablets together, then 1 every day until finished. 01/06/19   Volanda Napoleon, PA-C  benzonatate (TESSALON) 100 MG capsule Take 1 capsule (100 mg total) by mouth every 8 (eight) hours. 01/06/19   Volanda Napoleon, PA-C  CYANOCOBALAMIN PO Take 1 tablet by mouth daily.     [provider]  ferrous sulfate 325 (65 FE) MG EC tablet Take 1 tablet (325 mg total) by mouth daily with breakfast. 10/31/10 10/31/11  Haygood, Seymour Bars, MD  Multiple Vitamins-Minerals (  MULTIVITAMIN WITH MINERALS) tablet Take 1 tablet by mouth daily.      [provider]  omeprazole (PRILOSEC) 20 MG capsule Take 20 mg by mouth daily.      [provider]    Family History No family history on file.  Social History Social History   Tobacco Use  . Smoking status: Never Smoker  . Smokeless tobacco: Never Used  Substance Use Topics  . Alcohol use: Not on file    Comment: rarely  . Drug use: No     Allergies   Patient has no known allergies.   Review of Systems Review of Systems  Constitutional: Positive for appetite change. Negative for fever.  Respiratory: Positive for cough. Negative for shortness of breath.   Cardiovascular: Negative for chest pain.  Gastrointestinal: Negative for abdominal pain.  Musculoskeletal: Positive for myalgias.  All other systems reviewed and are negative.    Physical Exam Updated Vital Signs BP  129/87 (BP Location: Right Arm)   Pulse 100   Temp 99.7 F (37.6 C) (Oral)   Resp 18   Ht 5\' 6"  (1.676 m)   Wt 82.1 kg   LMP 10/23/2010   SpO2 100%   BMI 29.21 kg/m   Physical Exam Vitals signs and nursing note reviewed.  Constitutional:      Appearance: She is well-developed.  HENT:     Head: Normocephalic and atraumatic.  Eyes:     General: No scleral icterus.       Right eye: No discharge.        Left eye: No discharge.     Conjunctiva/sclera: Conjunctivae normal.  Pulmonary:     Effort: Pulmonary effort is normal.     Comments: Lungs clear to auscultation bilaterally.  Symmetric chest rise.  No wheezing, rales, rhonchi. Skin:    General: Skin is warm and dry.  Neurological:     Mental Status: She is alert.  Psychiatric:        Speech: Speech normal.        Behavior: Behavior normal.      ED Treatments / Results  Labs (all labs ordered are listed, but only abnormal results are displayed) Labs Reviewed  SARS CORONAVIRUS 2 (TAT 6-24 HRS)  PREGNANCY, URINE    EKG None  Radiology Dg Chest Portable 1 View  Result Date: 01/06/2019 CLINICAL DATA:  Cough, left-sided chest pain and fatigue for 4 days EXAM: PORTABLE CHEST 1 VIEW COMPARISON:  Radiograph 04/10/2010 FINDINGS: Streaky areas of basilar subsegmental atelectasis are present with more patchy confluent opacity in the left mid to lower lung. No pneumothorax or visible effusion. The cardiomediastinal contours are unremarkable. No acute osseous or soft tissue abnormality IMPRESSION: Confluent opacity in the left mid to lower lung, suspicious for pneumonia. Recommend follow-up chest x-ray in 4-6 weeks to ensure resolution. Additional basilar areas of subsegmental atelectasis. Electronically Signed   By: Lovena Le M.D.   On: 01/06/2019 16:44    Procedures Procedures (including critical care time)  Medications Ordered in ED Medications  acetaminophen (TYLENOL) tablet 1,000 mg (1,000 mg Oral Given 01/06/19 1649)      Initial Impression / Assessment and Plan / ED Course  I have reviewed the triage vital signs and the nursing notes.  Pertinent labs & imaging results that were available during my care of the patient were reviewed by me and considered in my medical decision making (see chart for details).        43 year old female who presents for  evaluation of cough, generalized body aches, decreased appetite x4 days.  No recent travel.  No known sick contacts.  No known COVID-19 exposure.  No Shaili arrival, she is afebrile, nontoxic-appearing.  Vital signs are stable.  No evidence of respiratory distress.  Consider viral process such as COVID-19 versus infectious process.  Will plan for chest x-ray, outpatient Covid test.  Chest x-ray shows complete opacity in the left mid to lower lung suspicious for pneumonia.  Discussed results with patient.  Instructed patient that she has a Covid test pending and quarantine until results come back.  I discussed chest x-ray findings with patient.  We discussed treatment options.  Patient would prefer to go and be discharged with antibiotics but was instructed to only take antibiotics if that she start developing fever, symptoms get worse. Patient with no known drug allergies. At this time, patient exhibits no emergent life-threatening condition that require further evaluation in ED or admission. Patient had ample opportunity for questions and discussion. All patient's questions were answered with full understanding. Strict return precautions discussed. Patient expresses understanding and agreement to plan.   Portions of this note were generated with Lobby[employer]. Dictation errors may occur despite best attempts at proofreading.    Final Clinical Impressions(s) / ED Diagnoses   Final diagnoses:  Viral URI with cough    ED Discharge Orders         Ordered    azithromycin (ZITHROMAX) 250 MG tablet  Daily,   Status:  Discontinued     01/06/19 1731     benzonatate (TESSALON) 100 MG capsule  Every 8 hours     01/06/19 1731    azithromycin (ZITHROMAX) 250 MG tablet  Daily     01/06/19 1732           Volanda Napoleon, PA-C 01/06/19 1737    Maudie Flakes, MD 01/06/19 2322

## 2019-01-06 NOTE — Discharge Instructions (Addendum)
As we discussed, you have been tested for Covid.  You can check online regarding results.   You can take Tylenol or Ibuprofen as directed for pain. You can alternate Tylenol and Ibuprofen every 4 hours. If you take Tylenol at 1pm, then you can take Ibuprofen at 5pm. Then you can take Tylenol again at 9pm.   Make sure you drink plenty of fluids and stay hydrated.  As we discussed, your chest x-ray did show some questionable early pneumonia.  As we discussed, you will be given antibiotics but do not start these.  Only start them if you develop fever or have worsening symptoms.      Person Under Monitoring Name: [name]  Location: [place] [address]   Infection Prevention Recommendations for Individuals Confirmed to have, or Being Evaluated for, 2019 Novel Coronavirus (COVID-19) Infection Who Receive Care at Home  Individuals who are confirmed to have, or are being evaluated for, COVID-19 should follow the prevention steps below until a healthcare provider or local or state health department says they can return to normal activities.  Stay home except to get medical care You should restrict activities outside your home, except for getting medical care. Do not go to work, school, or public areas, and do not use public transportation or taxis.  Call ahead before visiting your doctor Before your medical appointment, call the healthcare provider and tell them that you have, or are being evaluated for, COVID-19 infection. This will help the healthcare providers office take steps to keep other people from getting infected. Ask your healthcare provider to call the local or state health department.  Monitor your symptoms Seek prompt medical attention if your illness is worsening (e.g., difficulty breathing). Before going to your medical appointment, call the healthcare provider and tell them that you have, or are being evaluated for, COVID-19 infection. Ask your  healthcare provider to call the local or state health department.  Wear a facemask You should wear a facemask that covers your nose and mouth when you are in the same room with other people and when you visit a healthcare provider. People who live with or visit you should also wear a facemask while they are in the same room with you.  Separate yourself from other people in your home As much as possible, you should stay in a different room from other people in your home. Also, you should use a separate bathroom, if available.  Avoid sharing household items You should not share dishes, drinking glasses, cups, eating utensils, towels, bedding, or other items with other people in your home. After using these items, you should wash them thoroughly with soap and water.  Cover your coughs and sneezes Cover your mouth and nose with a tissue when you cough or sneeze, or you can cough or sneeze into your sleeve. Throw used tissues in a lined trash can, and immediately wash your hands with soap and water for at least 20 seconds or use an alcohol-based hand rub.  Wash your [employer] your hands often and thoroughly with soap and water for at least 20 seconds. You can use an alcohol-based hand sanitizer if soap and water are not available and if your hands are not visibly dirty. Avoid touching your eyes, nose, and mouth with unwashed hands.   Prevention Steps for Caregivers and Household Members of Individuals Confirmed to have, or Being Evaluated for, COVID-19 Infection Being Cared for in the Home  If you live with, or  provide care at home for, a person confirmed to have, or being evaluated for, COVID-19 infection please follow these guidelines to prevent infection:  Follow healthcare providers instructions Make sure that you understand and can help the patient follow any healthcare provider instructions for all care.  Provide for the patients basic needs You should help the patient with  basic needs in the home and provide support for getting groceries, prescriptions, and other personal needs.  Monitor the patients symptoms If they are getting sicker, call his or her medical provider and tell them that the patient has, or is being evaluated for, COVID-19 infection. This will help the healthcare providers office take steps to keep other people from getting infected. Ask the healthcare provider to call the local or state health department.  Limit the number of people who have contact with the patient If possible, have only one caregiver for the patient. Other household members should stay in another home or place of residence. If this is not possible, they should stay in another room, or be separated from the patient as much as possible. Use a separate bathroom, if available. Restrict visitors who do not have an essential need to be in the home.  Keep older adults, very young children, and other sick people away from the patient Keep older adults, very young children, and those who have compromised immune systems or chronic health conditions away from the patient. This includes people with chronic heart, lung, or kidney conditions, diabetes, and cancer.  Ensure good ventilation Make sure that shared spaces in the home have good air flow, such as from an air conditioner or an opened window, weather permitting.  Wash your hands often Wash your hands often and thoroughly with soap and water for at least 20 seconds. You can use an alcohol based hand sanitizer if soap and water are not available and if your hands are not visibly dirty. Avoid touching your eyes, nose, and mouth with unwashed hands. Use disposable paper towels to dry your hands. If not available, use dedicated cloth towels and replace them when they become wet.  Wear a facemask and gloves Wear a disposable facemask at all times in the room and gloves when you touch or have contact with the patients blood, body  fluids, and/or secretions or excretions, such as sweat, saliva, sputum, nasal mucus, vomit, urine, or feces.  Ensure the mask fits over your nose and mouth tightly, and do not touch it during use. Throw out disposable facemasks and gloves after using them. Do not reuse. Wash your hands immediately after removing your facemask and gloves. If your personal clothing becomes contaminated, carefully remove clothing and launder. Wash your hands after handling contaminated clothing. Place all used disposable facemasks, gloves, and other waste in a lined container before disposing them with other household waste. Remove gloves and wash your hands immediately after handling these items.  Do not share dishes, glasses, or other household items with the patient Avoid sharing household items. You should not share dishes, drinking glasses, cups, eating utensils, towels, bedding, or other items with a patient who is confirmed to have, or being evaluated for, COVID-19 infection. After the person uses these items, you should wash them thoroughly with soap and water.  Wash laundry thoroughly Immediately remove and wash clothes or bedding that have blood, body fluids, and/or secretions or excretions, such as sweat, saliva, sputum, nasal mucus, vomit, urine, or feces, on them. Wear gloves when handling laundry from the patient. Read and follow  directions on labels of laundry or clothing items and detergent. In general, wash and dry with the warmest temperatures recommended on the label.  Clean all areas the individual has used often Clean all touchable surfaces, such as counters, tabletops, doorknobs, bathroom fixtures, toilets, phones, keyboards, tablets, and bedside tables, every day. Also, clean any surfaces that may have blood, body fluids, and/or secretions or excretions on them. Wear gloves when cleaning surfaces the patient has come in contact with. Use a diluted bleach solution (e.g., dilute bleach with 1  part bleach and 10 parts water) or a household disinfectant with a label that says EPA-registered for coronaviruses. To make a bleach solution at home, add 1 tablespoon of bleach to 1 quart (4 cups) of water. For a larger supply, add  cup of bleach to 1 gallon (16 cups) of water. Read labels of cleaning products and follow recommendations provided on product labels. Labels contain instructions for safe and effective use of the cleaning product including precautions you should take when applying the product, such as wearing gloves or eye protection and making sure you have good ventilation during use of the product. Remove gloves and wash hands immediately after cleaning.  Monitor yourself for signs and symptoms of illness Caregivers and household members are considered close contacts, should monitor their health, and will be asked to limit movement outside of the home to the extent possible. Follow the monitoring steps for close contacts listed on the symptom monitoring form.   ? If you have additional questions, contact your local health department or call the epidemiologist on call at [phone] (available 24/7). ? This guidance is subject to change. For the most up-to-date guidance from [facility], please refer to their website: [email or web address]

## 2019-01-06 NOTE — ED Notes (Signed)
ED Provider at bedside. 

## 2019-01-10 ENCOUNTER — Emergency Department (HOSPITAL_BASED_OUTPATIENT_CLINIC_OR_DEPARTMENT_OTHER): Payer: 59

## 2019-01-10 ENCOUNTER — Other Ambulatory Visit: Payer: Self-pay

## 2019-01-10 ENCOUNTER — Encounter (HOSPITAL_BASED_OUTPATIENT_CLINIC_OR_DEPARTMENT_OTHER): Payer: Self-pay

## 2019-01-10 ENCOUNTER — Emergency Department (HOSPITAL_BASED_OUTPATIENT_CLINIC_OR_DEPARTMENT_OTHER)
Admission: EM | Admit: 2019-01-10 | Discharge: 2019-01-10 | Disposition: A | Discharge status: Home / Self Care | Payer: 59 | Attending: Emergency Medicine | Admitting: Emergency Medicine

## 2019-01-10 DIAGNOSIS — R0781 Pleurodynia: Secondary | ICD-10-CM | POA: Diagnosis not present

## 2019-01-10 DIAGNOSIS — R0602 Shortness of breath: Secondary | ICD-10-CM | POA: Diagnosis not present

## 2019-01-10 DIAGNOSIS — Z79899 Other long term (current) drug therapy: Secondary | ICD-10-CM | POA: Insufficient documentation

## 2019-01-10 DIAGNOSIS — R05 Cough: Secondary | ICD-10-CM

## 2019-01-10 DIAGNOSIS — U071 COVID-19: Secondary | ICD-10-CM | POA: Insufficient documentation

## 2019-01-10 DIAGNOSIS — R059 Cough, unspecified: Secondary | ICD-10-CM

## 2019-01-10 NOTE — Discharge Instructions (Addendum)
Please follow-up with your PCP if your symptoms fail to improve.  Return to the ED or seek medical attention should you develop any new or worsening symptoms.  Please take your medications, as prescribed.   If you live with, or provide care at home for, a person confirmed to have, or being evaluated for, COVID-19 infection please follow these guidelines to prevent infection:  Follow healthcare providers instructions Make sure that you understand and can help the patient follow any healthcare provider instructions for all care.  Provide for the patients basic needs You should help the patient with basic needs in the home and provide support for getting groceries, prescriptions, and other personal needs.  Monitor the patients symptoms If they are getting sicker, call his or her medical provider a  This will help the healthcare providers office take steps to keep other people from getting infected. Ask the healthcare provider to call the local or state health department.  Limit the number of people who have contact with the patient If possible, have only one caregiver for the patient. Other household members should stay in another home or place of residence. If this is not possible, they should stay in another room, or be separated from the patient as much as possible. Use a separate bathroom, if available. Restrict visitors who do not have an essential need to be in the home.  Keep older adults, very young children, and other sick people away from the patient Keep older adults, very young children, and those who have compromised immune systems or chronic health conditions away from the patient. This includes people with chronic heart, lung, or kidney conditions, diabetes, and cancer.  Ensure good ventilation Make sure that shared spaces in the home have good air flow, such as from an air conditioner or an opened window, weather permitting.  Wash your hands often Wash your hands  often and thoroughly with soap and water for at least 20 seconds. You can use an alcohol based hand sanitizer if soap and water are not available and if your hands are not visibly dirty. Avoid touching your eyes, nose, and mouth with unwashed hands. Use disposable paper towels to dry your hands. If not available, use dedicated cloth towels and replace them when they become wet.  Wear a facemask and gloves Wear a disposable facemask at all times in the room and gloves when you touch or have contact with the patients blood, body fluids, and/or secretions or excretions, such as sweat, saliva, sputum, nasal mucus, vomit, urine, or feces.  Ensure the mask fits over your nose and mouth tightly, and do not touch it during use. Throw out disposable facemasks and gloves after using them. Do not reuse. Wash your hands immediately after removing your facemask and gloves. If your personal clothing becomes contaminated, carefully remove clothing and launder. Wash your hands after handling contaminated clothing. Place all used disposable facemasks, gloves, and other waste in a lined container before disposing them with other household waste. Remove gloves and wash your hands immediately after handling these items.  Do not share dishes, glasses, or other household items with the patient Avoid sharing household items. You should not share dishes, drinking glasses, cups, eating utensils, towels, bedding, or other items After the person uses these items, you should wash them thoroughly with soap and water.  Wash laundry thoroughly Immediately remove and wash clothes or bedding that have blood, body fluids, and/or secretions or excretions, such as sweat, saliva, sputum, nasal mucus, vomit, urine,  or feces, on them. Wear gloves when handling laundry from the patient. Read and follow directions on labels of laundry or clothing items and detergent. In general, wash and dry with the warmest temperatures recommended on  the label.  Clean all areas the individual has used often Clean all touchable surfaces, such as counters, tabletops, doorknobs, bathroom fixtures, toilets, phones, keyboards, tablets, and bedside tables, every day. Also, clean any surfaces that may have blood, body fluids, and/or secretions or excretions on them. Wear gloves when cleaning surfaces the patient has come in contact with. Use a diluted bleach solution (e.g., dilute bleach with 1 part bleach and 10 parts water) or a household disinfectant with a label that says EPA-registered for coronaviruses. To make a bleach solution at home, add 1 tablespoon of bleach to 1 quart (4 cups) of water. For a larger supply, add  cup of bleach to 1 gallon (16 cups) of water. Read labels of cleaning products and follow recommendations provided on product labels. Labels contain instructions for safe and effective use of the cleaning product including precautions you should take when applying the product, such as wearing gloves or eye protection and making sure you have good ventilation during use of the product. Remove gloves and wash hands immediately after cleaning.  Monitor yourself for signs and symptoms of illness Caregivers and household members are considered close contacts, should monitor their health, and will be asked to limit movement outside of the home to the extent possible. Follow the monitoring steps for close contacts listed on the symptom monitoring form.   ? If you have additional questions, contact your local health department or call the epidemiologist on call at [phone] (available 24/7). ? This guidance is subject to change. For the most up-to-date guidance from [facility], please refer to their website: [email or web address]

## 2019-01-10 NOTE — ED Notes (Signed)
X-ray at bedside

## 2019-01-10 NOTE — ED Notes (Signed)
ED Provider at bedside. 

## 2019-01-10 NOTE — ED Provider Notes (Addendum)
[facility] EMERGENCY DEPARTMENT Provider Note   CSN: [id number] Arrival date & time: 01/10/19  1337     History   Chief Complaint No chief complaint on file.   HPI [name] is a 43 y.o. female with no significant past medical history presents to the ED with increasing shortness of breath symptoms, pleuritic chest pain, and pleuritic cough subsequent to positive COVID-19 testing obtained 4 days ago on 01/06/2019.  She reports that her body aches, fevers and chills, and other flu-like symptoms have improved significantly, but reports that her shortness of breath symptoms have worsened.  She reports that she can no longer ambulate while simultaneously speaking without becoming short of breath and she reports pleuritic cough with deep inspiration.  She was prescribed azithromycin with instructions to take should she develop worsening fevers and other symptoms, but she has not yet taken her antibiotics.  She continues to take Tylenol and over-the-counter medications as needed for symptomatic relief.  She also takes [employer], but reports that she still continues to experience pleuritic cough.  She denies any oral contraceptive or tobacco use.  No personal history of stroke, clot, coagulopathy, leg swelling, or recent immobilization.     HPI  Past Medical History:  Diagnosis Date  . Bladder infection 05/13/2010  . Blood dyscrasia    takes B12  . Chicken pox 05/13/2010  . Endometriosis   . Fibroids 10/10/2010  . GERD (gastroesophageal reflux disease)    otc Prilosec when needed  . Hx of gonorrhea   . Irritable bowel 05/13/2010  . Lichen sclerosus et atrophicus of the vulva 10/10/2010  . Low iron    During pregnancy  . Migraines 05/13/2010  . Pelvic pain    s/p hysterectomy  . PONV (postoperative nausea and vomiting)   . Yeast infection     Patient Active Problem List   Diagnosis Date Noted  . 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 Concern about STD in female without  diagnosis 06/15/2011  . Urinary frequency 06/15/2011  . Endometriosis 10/31/2010    Past Surgical History:  Procedure Laterality Date  . ABDOMINAL HYSTERECTOMY    . APPENDECTOMY    . FRACTURE SURGERY  1988   left leg  . LAPAROSCOPIC OVARIAN CYSTECTOMY  06/2010  . LAPAROSCOPY  2012  . LAPAROSCOPY  10/30/2010   Procedure: LAPAROSCOPY DIAGNOSTIC;  Surgeon: Eldred Manges, MD;  Location: [facility];  Service: Gynecology;  Laterality: N/A;  . LAPAROSCOPY FOR ECTOPIC PREGNANCY    . TUBAL LIGATION  2012   bilateral  . VAGINAL HYSTERECTOMY  10/30/2010   Procedure: HYSTERECTOMY VAGINAL;  Surgeon: Eldred Manges, MD;  Location: [facility];  Service: Gynecology;  Laterality: N/A;     OB History    Gravida  3   Para  2   Term  2   Preterm  0   AB  1   Living  2     SAB  0   TAB  0   Ectopic  1   Multiple  0   Live Births               Home Medications    Prior to Admission medications   Medication Sig Start Date End Date Taking? Authorizing Provider  azithromycin (ZITHROMAX) 250 MG tablet Take 1 tablet (250 mg total) by mouth daily. Take first 2 tablets together, then 1 every day until finished. 01/06/19   Volanda Napoleon, PA-C  benzonatate (TESSALON) 100 MG capsule Take  1 capsule (100 mg total) by mouth every 8 (eight) hours. 01/06/19   Volanda Napoleon, PA-C  Multiple Vitamins-Minerals (MULTIVITAMIN WITH MINERALS) tablet Take 1 tablet by mouth daily.      [provider]  omeprazole (PRILOSEC) 40 MG capsule Take 40 mg by mouth daily. 12/04/18   [provider]  predniSONE (DELTASONE) 20 MG tablet Take 20 mg by mouth daily. 12/27/18   [provider]    Family History No family history on file.  Social History Social History   Tobacco Use  . Smoking status: Never Smoker  . Smokeless tobacco: Never Used  Substance Use Topics  . Alcohol use: Not on file    Comment: rarely  . Drug use: No     Allergies   Patient has no known  allergies.   Review of Systems Review of Systems  Constitutional: Negative for chills and fever.  Respiratory: Positive for chest tightness and shortness of breath.   Cardiovascular: Negative for chest pain and leg swelling.     Physical Exam Updated Vital Signs BP 124/81   Pulse 95   Temp 97.6 F (36.4 C) (Oral)   Resp 15   Ht 5\' 6"  (1.676 m)   Wt 82.1 kg   LMP 10/23/2010   SpO2 100%   BMI 29.21 kg/m   Physical Exam Vitals signs and nursing note reviewed. Exam conducted with a chaperone present.  Constitutional:      Appearance: Normal appearance.  HENT:     Head: Normocephalic and atraumatic.  Eyes:     General: No scleral icterus.    Conjunctiva/sclera: Conjunctivae normal.  Cardiovascular:     Rate and Rhythm: Normal rate and regular rhythm.     Pulses: Normal pulses.     Heart sounds: Normal heart sounds.  Pulmonary:     Effort: Pulmonary effort is normal. No respiratory distress.     Breath sounds: Normal breath sounds.  Musculoskeletal:     Right lower leg: No edema.     Left lower leg: No edema.  Skin:    General: Skin is dry.  Neurological:     Mental Status: She is alert.     GCS: GCS eye subscore is 4. GCS verbal subscore is 5. GCS motor subscore is 6.  Psychiatric:        Mood and Affect: Mood normal.        Behavior: Behavior normal.        Thought Content: Thought content normal.      ED Treatments / Results  Labs (all labs ordered are listed, but only abnormal results are displayed) Labs Reviewed - No data to display  EKG EKG Interpretation  Date/Time:  Monday January 10 2019 13:52:44 EST Ventricular Rate:  80 PR Interval:    QRS Duration: 84 QT Interval:  375 QTC Calculation: 433 R Axis:   50 Text Interpretation: Sinus rhythm Baseline wander in lead(s) II III aVF V3 V5 No old tracing to compare Confirmed by Aletta Edouard [phone]) on 01/10/2019 2:00:06 PM   Radiology Dg Chest Port 1 View  Result Date: 01/10/2019 CLINICAL  DATA:  Shortness of breath and chest pain, COVID positive EXAM: PORTABLE CHEST 1 VIEW COMPARISON:  01/06/2019 FINDINGS: Patchy airspace opacities at the right base, left mid lung and left base. No pleural effusion. Normal heart size. No pneumothorax. IMPRESSION: Patchy airspace opacities within the left greater than right lungs, suspect for multifocal pneumonia and given clinical history of COVID positivity. Findings are stable  to minimally decreased compared to 01/06/2019 Electronically Signed   By: Donavan Foil M.D.   On: 01/10/2019 14:14    Procedures Procedures (including critical care time)  Medications Ordered in ED Medications - No data to display   Initial Impression / Assessment and Plan / ED Course  I have reviewed the triage vital signs and the nursing notes.  Pertinent labs & imaging results that were available during my care of the patient were reviewed by me and considered in my medical decision making (see chart for details).    Patient has already tested positive for COVID-19 and is approximately day 8 since her initial onset of symptoms.  She had been prescribed Zithromax for her multifocal pneumonia, which she has not yet filled.  We will ambulate the patient here in the ED while on pulse oximetry to evaluate for activity induced hypoxia.   Patient ambulated with dyspnea on exertion, but no hypoxia.  Patient is PERC negative and we are not immediately concerned for pulmonary embolism at this time.  She is neither tachycardic, tachypneic, or hypoxic.  Do not feel as though D-dimer any further work-up is warranted.  Patient is also low risk for cardiac etiology and we have low suspicion for ACS at this time.  Her chest discomfort is purely associated with her pleuritic cough.  She will continue to take [employer], as prescribed.  Chest x-ray obtained demonstrates patchy airspace opacities concerning for multifocal pneumonia, likely due to COVID-19 infection.  This known  finding can very well explain her symptoms of shortness of breath, dyspnea on exertion, and pleuritic chest pain.  Discussed this with the patient and she understood.  Patient was provided with return precautions. She voiced understanding and is agreeable to the plan.   [name] was evaluated in Emergency Department on 01/10/2019 for the symptoms described in the history of present illness. She was evaluated in the context of the global COVID-19 pandemic, which necessitated consideration that the patient might be at risk for infection with the SARS-CoV-2 virus that causes COVID-19. Institutional protocols and algorithms that pertain to the evaluation of patients at risk for COVID-19 are in a state of rapid change based on information released by regulatory bodies including the CDC and federal and state organizations. These policies and algorithms were followed during the patient's care in the ED.      Final Clinical Impressions(s) / ED Diagnoses   Final diagnoses:  Shortness of breath    ED Discharge Orders    None       Corena Herter, PA-C 01/10/19 1654    Corena Herter, PA-C 01/10/19 1658    Hayden Rasmussen, MD 01/10/19 1744

## 2019-01-10 NOTE — ED Notes (Signed)
Ambulated in room. On RA SpO2 97-100%, HR 86-106, RR 22-26, +DOE noted.

## 2019-01-10 NOTE — ED Triage Notes (Signed)
Pt presents with increased SOB - mid sternal chest pain x 1 week. Pt covid +. Reports body aches and fevers have decreased.

## 2019-01-10 NOTE — ED Notes (Signed)
Pt was prescribed abx and did not get medication filled.

## 2019-11-16 ENCOUNTER — Other Ambulatory Visit: Payer: Self-pay | Admitting: Family Medicine

## 2019-11-16 DIAGNOSIS — Z1231 Encounter for screening mammogram for malignant neoplasm of breast: Secondary | ICD-10-CM

## 2019-12-16 ENCOUNTER — Ambulatory Visit
Admission: RE | Admit: 2019-12-16 | Discharge: 2019-12-16 | Disposition: A | Discharge status: Home / Self Care | Payer: 59 | Source: Ambulatory Visit | Attending: Family Medicine | Admitting: Family Medicine

## 2019-12-16 ENCOUNTER — Other Ambulatory Visit: Payer: Self-pay

## 2019-12-16 DIAGNOSIS — Z1231 Encounter for screening mammogram for malignant neoplasm of breast: Secondary | ICD-10-CM

## 2020-08-21 ENCOUNTER — Other Ambulatory Visit: Payer: Self-pay | Admitting: Family Medicine

## 2020-08-21 DIAGNOSIS — Z1231 Encounter for screening mammogram for malignant neoplasm of breast: Secondary | ICD-10-CM

## 2020-12-17 ENCOUNTER — Other Ambulatory Visit: Payer: Self-pay

## 2020-12-17 ENCOUNTER — Ambulatory Visit
Admission: RE | Admit: 2020-12-17 | Discharge: 2020-12-17 | Disposition: A | Discharge status: Home / Self Care | Payer: 59 | Source: Ambulatory Visit | Attending: Family Medicine | Admitting: Family Medicine

## 2020-12-17 DIAGNOSIS — Z1231 Encounter for screening mammogram for malignant neoplasm of breast: Secondary | ICD-10-CM

## 2021-06-28 ENCOUNTER — Encounter: Payer: Self-pay | Admitting: Neurology

## 2021-08-08 ENCOUNTER — Encounter: Payer: Self-pay | Admitting: Neurology

## 2021-09-24 NOTE — Progress Notes (Unsigned)
NEUROLOGY CONSULTATION NOTE  [name] MRN: [id number] DOB: 20-Jun-1975  Referring provider: Sela Hilding, MD Primary care provider: Sela Hilding, MD  Reason for consult:  migraines  Assessment/Plan:   Migraine without aura, without status migrainosus, not intractable  Migraine prevention:  Start Aimovig '140mg'$  every 28 days (tried topiramate and propranolol).  Continue propranolol for now. Migraine rescue:  sumatriptan '100mg'$ , Zofran '4mg'$  Limit use of pain relievers to no more than 2 days out of week to prevent risk of rebound or medication-overuse headache. Keep headache diary Follow up 4 months.    Subjective:  [name] is a 46 year old female with unspecified anemia who presents for migraines.  History supplemented by referring provider's note.  Onset:  since her 36s but became worse during pregnancy and since her late-30s Location:  temples (unilateral or bilateral), bi-frontal, back of head and neck, top of head, one time radiated down face. Quality:  throbbing, pounding, squeezing.  Sometimes burning sensation of scalp Intensity:  10/10.   Aura:  no preceding aura Prodrome:  absent Associated symptoms:  Nausea, photophobia, phonophobia, sometimes floaters.  She denies associated vomiting, unilateral numbness or weakness. Duration:  all day Frequency:  daily Frequency of abortive medication: 3 days a week Triggers:  sleep deprivation, too much sleep, quick movements, stress, certain strong smells), too much caffeine or caffeine withdrawal, pregnancy Relieving factors:  close eyes, heating pad Activity:  aggravates  Past NSAIDS/analgesics:  Fioricet, ibuprofen (increased blood pressure) Past abortive triptans:  none Past abortive ergotamine:  none Past muscle relaxants:  none Past anti-emetic:  none Past antihypertensive medications:  none Past antidepressant medications:  none Past anticonvulsant medications:  topiramate Past anti-CGRP:   none Past vitamins/Herbal/Supplements:  none Past antihistamines/decongestants:  none Other past therapies:  massage (helped), chiropractic medicine (helped)  Current NSAIDS/analgesics:  Tylenol, rarely BC Current triptans:  none Current ergotamine:  none Current anti-emetic:  none Current muscle relaxants:  cyclobenzaprine '10mg'$  QD PRN Current Antihypertensive medications:  propranolol '20mg'$  daily Current Antidepressant medications:  none Current Anticonvulsant medications:  none Current anti-CGRP:  none Current Vitamins/Herbal/Supplements:  iron, B, D, MVI Current Antihistamines/Decongestants:  Flonase Other therapy:  none Hormone/birth control:  none Other medications:  buspirone   Caffeine:  1 cup coffee but not daily Needs to increase water intake Depression:  no; Anxiety:  stable Sleep hygiene:  Poor.  Wakes up during night at least 3 times to use bathroom Family history of headache:  son      PAST MEDICAL HISTORY: Past Medical History:  Diagnosis Date   Bladder infection 05/13/2010   Blood dyscrasia    takes B12   Chicken pox 05/13/2010   Endometriosis    Fibroids 10/10/2010   GERD (gastroesophageal reflux disease)    otc Prilosec when needed   Hx of gonorrhea    Irritable bowel 08/24/7626   Lichen sclerosus et atrophicus of the vulva 10/10/2010   Low iron    During pregnancy   Migraines 05/13/2010   Pelvic pain    s/p hysterectomy   PONV (postoperative nausea and vomiting)    Yeast infection     PAST SURGICAL HISTORY: Past Surgical History:  Procedure Laterality Date   ABDOMINAL HYSTERECTOMY     APPENDECTOMY     FRACTURE SURGERY  1988   left leg   LAPAROSCOPIC OVARIAN CYSTECTOMY  06/2010   LAPAROSCOPY  2012   LAPAROSCOPY  10/30/2010   Procedure: LAPAROSCOPY DIAGNOSTIC;  Surgeon: Eldred Manges, MD;  Location: [facility];  Service: Gynecology;  Laterality: N/A;   LAPAROSCOPY FOR ECTOPIC PREGNANCY     TUBAL LIGATION  2012   bilateral   VAGINAL HYSTERECTOMY   10/30/2010   Procedure: HYSTERECTOMY VAGINAL;  Surgeon: Eldred Manges, MD;  Location: [facility];  Service: Gynecology;  Laterality: N/A;    MEDICATIONS: Current Outpatient Medications on File Prior to Visit  Medication Sig Dispense Refill   azithromycin (ZITHROMAX) 250 MG tablet Take 1 tablet (250 mg total) by mouth daily. Take first 2 tablets together, then 1 every day until finished. 6 tablet 0   benzonatate (TESSALON) 100 MG capsule Take 1 capsule (100 mg total) by mouth every 8 (eight) hours. 21 capsule 0   Multiple Vitamins-Minerals (MULTIVITAMIN WITH MINERALS) tablet Take 1 tablet by mouth daily.       omeprazole (PRILOSEC) 40 MG capsule Take 40 mg by mouth daily.     predniSONE (DELTASONE) 20 MG tablet Take 20 mg by mouth daily.     No current facility-administered medications on file prior to visit.    ALLERGIES: No Known Allergies  FAMILY HISTORY: No family history on file.  Objective:  Blood pressure 128/78, pulse 70, height '5\' 6"'$  (1.676 m), weight 177 lb (80.3 kg), last menstrual period 09/30/2010, SpO2 100 %. General: No acute distress.  Patient appears well-groomed.   Head:  Normocephalic/atraumatic Eyes:  fundi examined but not visualized Neck: supple, no paraspinal tenderness, full range of motion Back: No paraspinal tenderness Heart: regular rate and rhythm Lungs: Clear to auscultation bilaterally. Vascular: No carotid bruits. Neurological Exam: Mental status: alert and oriented to person, place, and time, speech fluent and not dysarthric, language intact. Cranial nerves: CN I: not tested CN II: pupils equal, round and reactive to light, visual fields intact CN III, IV, VI:  full range of motion, no nystagmus, no ptosis CN V: facial sensation intact. CN VII: upper and lower face symmetric CN VIII: hearing intact CN IX, X: gag intact, uvula midline CN XI: sternocleidomastoid and trapezius muscles intact CN XII: tongue midline Bulk & Tone: normal, no  fasciculations. Motor:  muscle strength 5/5 throughout Sensation:  Pinprick, temperature and vibratory sensation intact. Deep Tendon Reflexes:  2+ throughout,  toes downgoing.   Finger to nose testing:  Without dysmetria.   Heel to shin:  Without dysmetria.   Gait:  Normal station and stride.  Romberg negative.    Thank you for allowing me to take part in the care of this patient.  Metta Clines, DO  CC: Sela Hilding, MD

## 2021-09-25 ENCOUNTER — Telehealth (HOSPITAL_COMMUNITY): Payer: Self-pay | Admitting: Pharmacy Technician

## 2021-09-25 ENCOUNTER — Encounter: Payer: Self-pay | Admitting: Neurology

## 2021-09-25 ENCOUNTER — Ambulatory Visit: Payer: 59 | Admitting: Neurology

## 2021-09-25 VITALS — BP 128/78 | HR 70 | Ht 66.0 in | Wt 177.0 lb

## 2021-09-25 DIAGNOSIS — G43009 Migraine without aura, not intractable, without status migrainosus: Secondary | ICD-10-CM | POA: Diagnosis not present

## 2021-09-25 MED ORDER — SUMATRIPTAN SUCCINATE 100 MG PO TABS: 100.0000 mg | ORAL_TABLET | ORAL | 5 refills | Status: DC | PRN

## 2021-09-25 MED ORDER — AIMOVIG 140 MG/ML ~~LOC~~ SOAJ: 140.0000 mg | SUBCUTANEOUS | 5 refills | Status: DC

## 2021-09-25 MED ORDER — ONDANSETRON HCL 4 MG PO TABS: 4.0000 mg | ORAL_TABLET | Freq: Three times a day (TID) | ORAL | 5 refills | Status: DC | PRN

## 2021-09-25 NOTE — Telephone Encounter (Signed)
Patient Advocate Encounter   Received notification that prior authorization for Aimovig '140MG'$ /ML auto-injectors is required.   PA submitted on 09/25/2021 Key [id number] Status is pending       Lyndel Safe, [place] Patient Advocate Specialist [place] Patient Advocate Team Direct Number: [phone]  Fax: [phone]

## 2021-09-25 NOTE — Patient Instructions (Signed)
  Start Aimovig '140mg'$  every 28 days.   Take sumatriptan '100mg'$  at earliest onset of headache.  May repeat dose once in 2 hours if needed.  Maximum 2 tablets in 24 hours. Ondansetron for nausea Limit use of pain relievers to no more than 2 days out of the week.  These medications include acetaminophen, NSAIDs (ibuprofen/Advil/Motrin, naproxen/Aleve, triptans (Imitrex/sumatriptan), Excedrin, and narcotics.  This will help reduce risk of rebound headaches. Be aware of common food triggers:  - Caffeine:  coffee, black tea, cola, Mt. Dew  - Chocolate  - Dairy:  aged cheeses (brie, blue, cheddar, gouda, [place], provolone, [place], Swiss, etc), chocolate milk, buttermilk, sour cream, limit eggs and yogurt  - Nuts, peanut butter  - Alcohol  - Cereals/grains:  FRESH breads (fresh bagels, sourdough, doughnuts), yeast productions  - Processed/canned/aged/cured meats (pre-packaged deli meats, hotdogs)  - MSG/glutamate:  soy sauce, flavor enhancer, pickled/preserved/marinated foods  - Sweeteners:  aspartame (Equal, Nutrasweet).  Sugar and Splenda are okay  - Vegetables:  legumes (lima beans, lentils, snow peas, fava beans, pinto peans, peas, garbanzo beans), sauerkraut, onions, olives, pickles  - Fruit:  avocados, bananas, citrus fruit (orange, lemon, grapefruit), mango  - Other:  Frozen meals, macaroni and cheese Routine exercise Stay adequately hydrated (aim for 64 oz water daily) Keep headache diary Maintain proper stress management Maintain proper sleep hygiene Do not skip meals Consider supplements:  magnesium citrate '400mg'$  daily, riboflavin '400mg'$  daily, coenzyme Q10 '100mg'$  three times daily.

## 2021-09-25 NOTE — Telephone Encounter (Signed)
Patient Advocate Encounter  Prior Authorization for Aimovig '140MG'$ /ML auto-injectors  has been approved.    PA# [id number] Effective dates: 09/25/2021 through 03/28/2022      Lyndel Safe, [place] Patient Advocate Specialist [place] Patient Advocate Team Direct Number: [phone]  Fax: [phone]

## 2021-09-27 ENCOUNTER — Ambulatory Visit: Payer: 59 | Admitting: Neurology

## 2021-12-06 ENCOUNTER — Other Ambulatory Visit: Payer: Self-pay | Admitting: Family Medicine

## 2021-12-06 DIAGNOSIS — Z1231 Encounter for screening mammogram for malignant neoplasm of breast: Secondary | ICD-10-CM

## 2021-12-14 IMAGING — MG MM DIGITAL SCREENING BILAT W/ TOMO AND CAD
8 series · 8 of 24 positions shown · non-contrast
Comparison: Previous exam(s).

CLINICAL DATA: Screening.

EXAM:
DIGITAL SCREENING BILATERAL MAMMOGRAM WITH TOMOSYNTHESIS AND CAD
TECHNIQUE: Bilateral screening digital craniocaudal and mediolateral oblique
mammograms were obtained. Bilateral screening digital breast
tomosynthesis was performed. The images were evaluated with
computer-aided detection.

[R CC synth-2D]
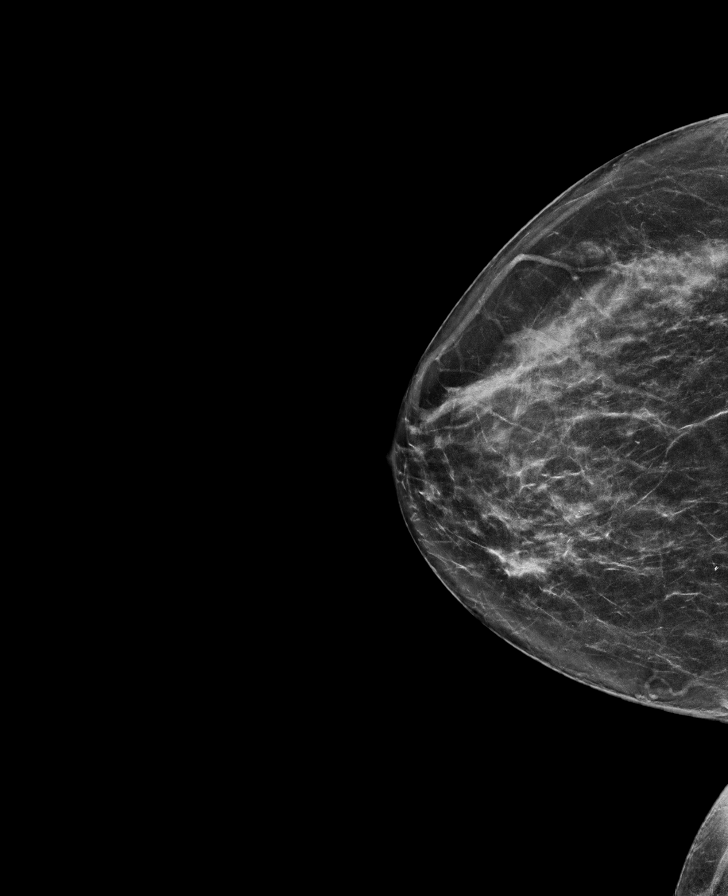

[R MLO synth-2D]
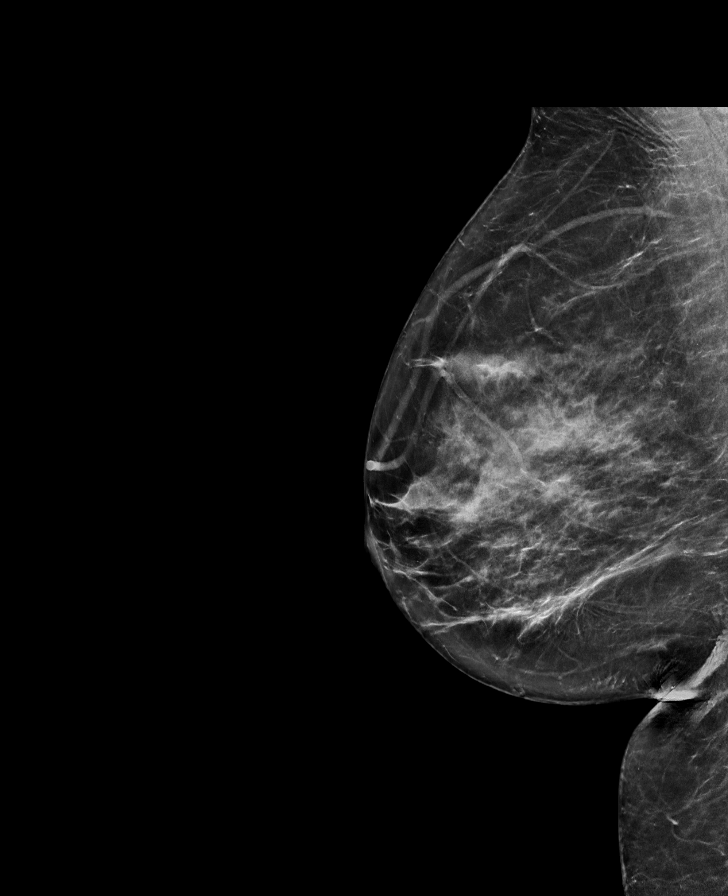

[L CC synth-2D]
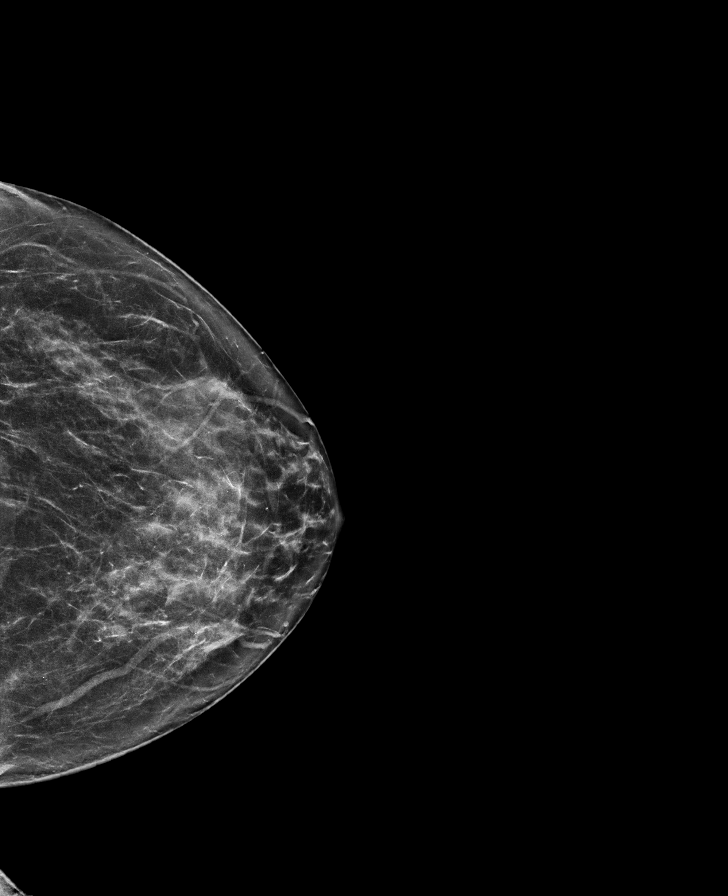

[L MLO synth-2D]
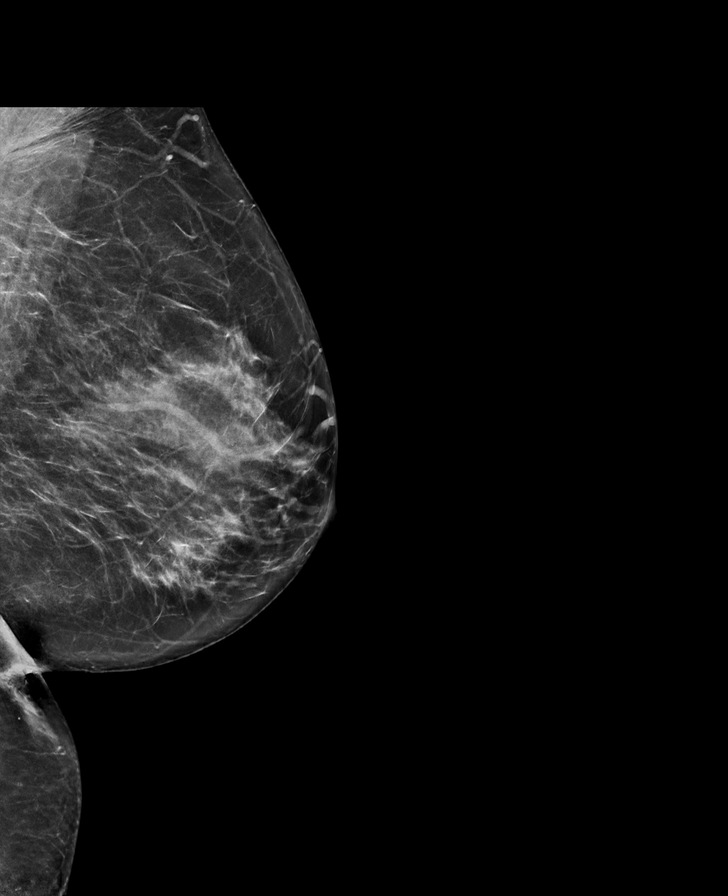

[R CC tomo · tomo slice 34/67.0]
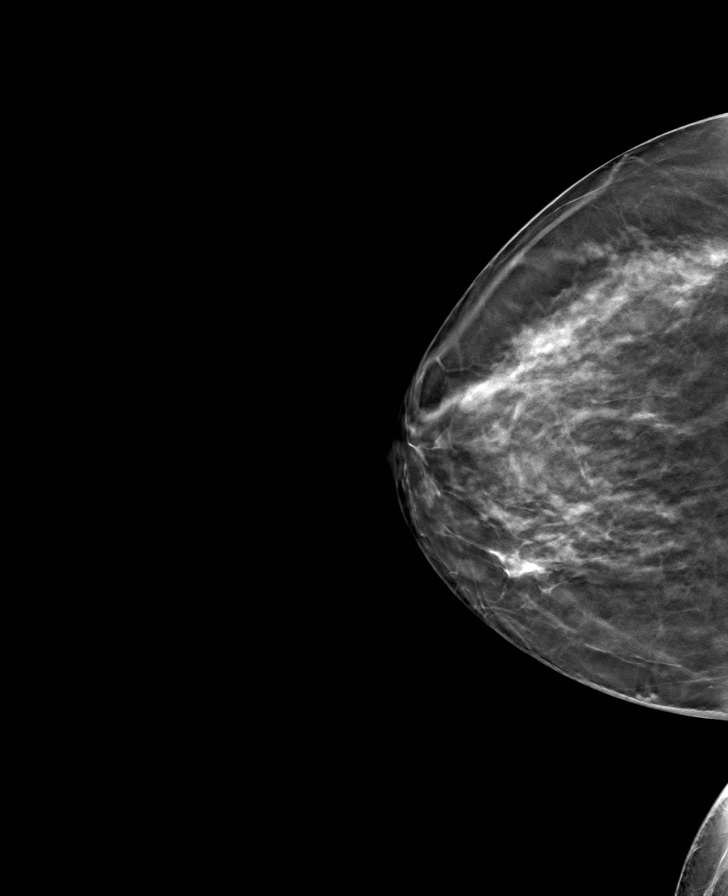

[R MLO tomo · tomo slice 37/73.0]
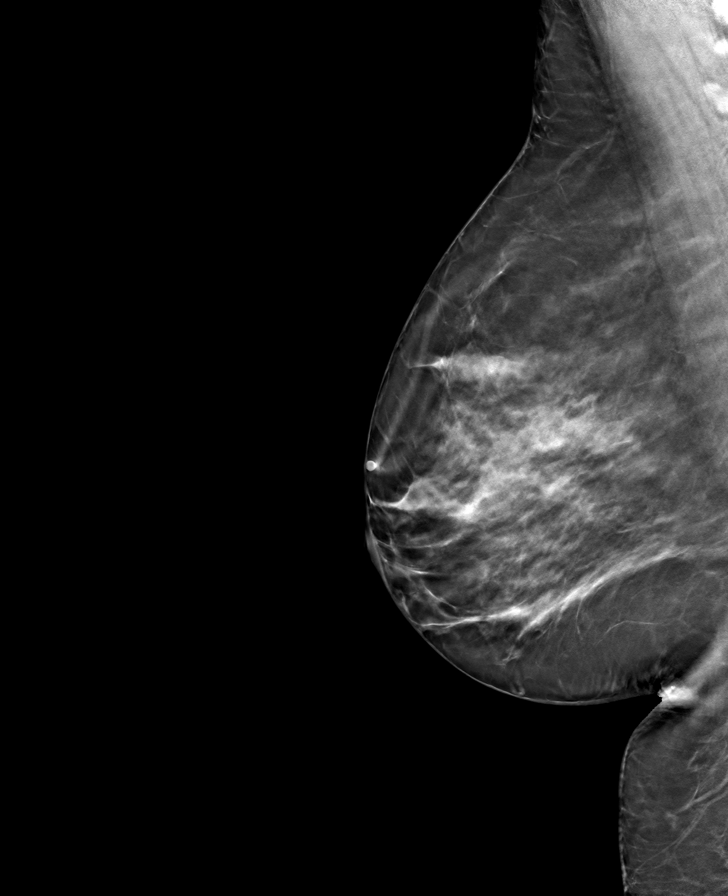

[L MLO tomo · tomo slice 36/71.0]
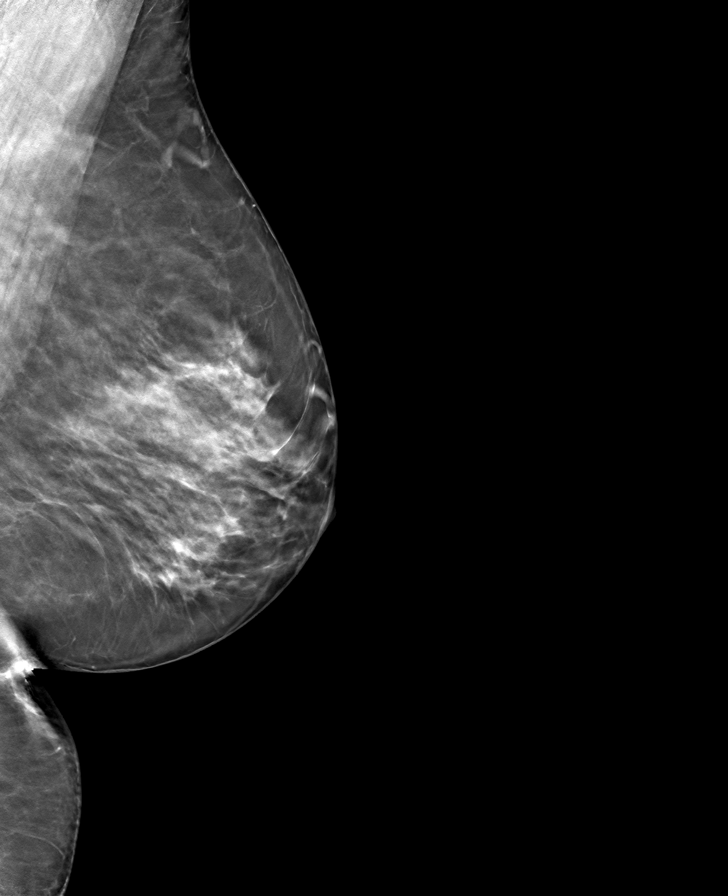

[L CC tomo · tomo slice 35/69.0]
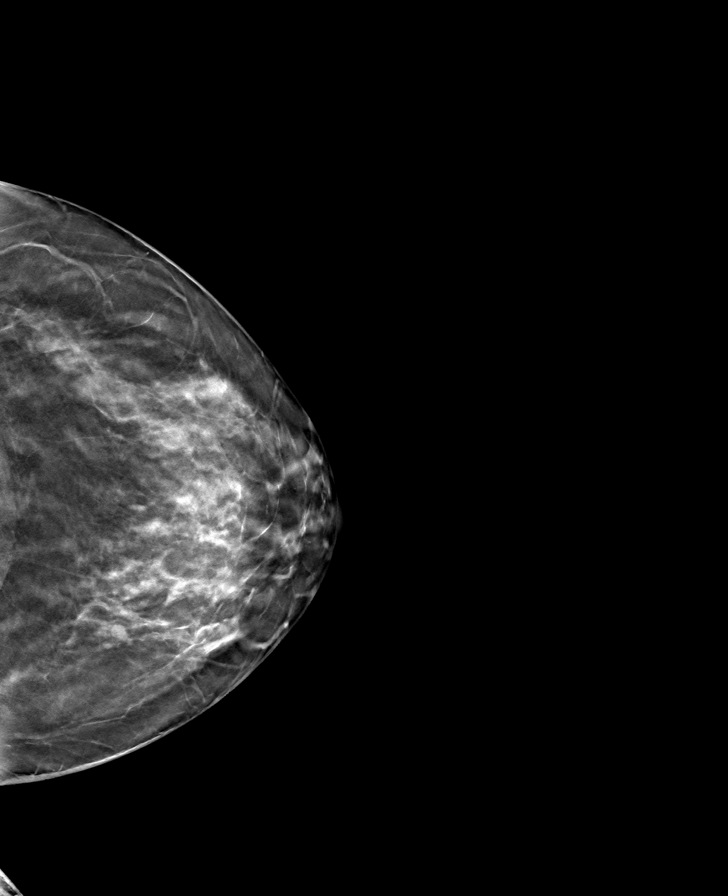

[8 of 24 positions shown; findings below may reference images not displayed]

ACR Breast Density Category c: The breast tissue is heterogeneously
dense, which may obscure small masses.
FINDINGS: There are no findings suspicious for malignancy.
IMPRESSION: No mammographic evidence of malignancy. A result letter of this
screening mammogram will be mailed directly to the patient.

RECOMMENDATION:
Screening mammogram in one year. (Code:Q3-W-BC3)

BI-RADS CATEGORY  1: Negative.

## 2022-01-06 ENCOUNTER — Ambulatory Visit
Admission: RE | Admit: 2022-01-06 | Discharge: 2022-01-06 | Disposition: A | Discharge status: Home / Self Care | Payer: 59 | Source: Ambulatory Visit | Attending: Family Medicine | Admitting: Family Medicine

## 2022-01-06 DIAGNOSIS — Z1231 Encounter for screening mammogram for malignant neoplasm of breast: Secondary | ICD-10-CM

## 2022-02-18 ENCOUNTER — Ambulatory Visit: Payer: 59 | Admitting: Neurology

## 2022-03-31 ENCOUNTER — Telehealth: Payer: Self-pay | Admitting: Pharmacy Technician

## 2022-03-31 NOTE — Telephone Encounter (Signed)
Patient Advocate Encounter   Received notification that prior authorization for Aimovig '140MG'$ /ML auto-injectors is required.   PA submitted on 03/31/2022 Key [id number] Status is pending       Lyndel Safe, [place] Patient Advocate Specialist [place] Patient Advocate Team Direct Number: [phone]  Fax: [phone]

## 2022-04-08 NOTE — Telephone Encounter (Signed)
PA has been APPROVED from 03/31/2022-04/01/2023

## 2022-04-25 NOTE — Progress Notes (Unsigned)
NEUROLOGY FOLLOW UP OFFICE NOTE  NZURI STENGLE [id number]  Assessment/Plan:   Migraine without aura, without status migrainosus, not intractable   Migraine prevention:  Aimovig '140mg'$  every 28 days; propranolol *** Migraine rescue:  sumatriptan '100mg'$ , Zofran '4mg'$  *** Limit use of pain relievers to no more than 2 days out of week to prevent risk of rebound or medication-overuse headache. Keep headache diary Follow up ***       Subjective:  [name] is a 47 year old female with unspecified anemia who follows up for migraines.  UPDATE: Intensity:  *** Duration:  *** Frequency:  *** Frequency of abortive medication: *** Current NSAIDS/analgesics:  none Current triptans:  sumatriptan '100mg'$  Current ergotamine:  none Current anti-emetic:  Zofran '4mg'$  Current muscle relaxants:  cyclobenzaprine '10mg'$  QD PRN Current Antihypertensive medications:  propranolol '20mg'$  daily Current Antidepressant medications:  none Current Anticonvulsant medications:  none Current anti-CGRP:  Aimovig '140mg'$  Current Vitamins/Herbal/Supplements:  iron, B, D, MVI Current Antihistamines/Decongestants:  Flonase Other therapy:  none Hormone/birth control:  none Other medications:  buspirone     Caffeine:  1 cup coffee but not daily Needs to increase water intake Depression:  no; Anxiety:  stable Sleep hygiene:  Poor.  Wakes up during night at least 3 times to use bathroom  HISTORY:  Onset:  since her 60s but became worse during pregnancy and since her late-30s Location:  temples (unilateral or bilateral), bi-frontal, back of head and neck, top of head, one time radiated down face. Quality:  throbbing, pounding, squeezing.  Sometimes burning sensation of scalp Intensity:  10/10.   Aura:  no preceding aura Prodrome:  absent Associated symptoms:  Nausea, photophobia, phonophobia, sometimes floaters.  She denies associated vomiting, unilateral numbness or weakness. Duration:  all day Frequency:   daily Frequency of abortive medication: 3 days a week Triggers:  sleep deprivation, too much sleep, quick movements, stress, certain strong smells), too much caffeine or caffeine withdrawal, pregnancy Relieving factors:  close eyes, heating pad Activity:  aggravates   Past NSAIDS/analgesics:  Fioricet, ibuprofen (increased blood pressure), Tylenol, BC Past abortive triptans:  none Past abortive ergotamine:  none Past muscle relaxants:  none Past anti-emetic:  none Past antihypertensive medications:  none Past antidepressant medications:  none Past anticonvulsant medications:  topiramate Past anti-CGRP:  none Past vitamins/Herbal/Supplements:  none Past antihistamines/decongestants:  none Other past therapies:  massage (helped), chiropractic medicine (helped)    Family history of headache:  son  PAST MEDICAL HISTORY: Past Medical History:  Diagnosis Date   Bladder infection 05/13/2010   Blood dyscrasia    takes B12   Chicken pox 05/13/2010   Endometriosis    Fibroids 10/10/2010   GERD (gastroesophageal reflux disease)    otc Prilosec when needed   Hx of gonorrhea    Irritable bowel 0000000   Lichen sclerosus et atrophicus of the vulva 10/10/2010   Low iron    During pregnancy   Migraines 05/13/2010   Pelvic pain    s/p hysterectomy   PONV (postoperative nausea and vomiting)    Yeast infection     MEDICATIONS: Current Outpatient Medications on File Prior to Visit  Medication Sig Dispense Refill   busPIRone (BUSPAR) 7.5 MG tablet Take 7.5 mg by mouth 2 (two) times daily.     cyclobenzaprine (FLEXERIL) 10 MG tablet Take 10 mg by mouth as needed.     Erenumab-aooe (AIMOVIG) 140 MG/ML SOAJ Inject 140 mg into the skin every 28 (twenty-eight) days. 1.12 mL 5  Ferrous Sulfate (IRON) 325 (65 Fe) MG TABS 1 tablet     fluconazole (DIFLUCAN) 150 MG tablet 1 tablet     fluticasone (FLONASE ALLERGY RELIEF) 50 MCG/ACT nasal spray 2 sprays     metroNIDAZOLE (FLAGYL) 500 MG tablet  Take by mouth.     Multiple Vitamins-Minerals (MULTIVITAMIN WITH MINERALS) tablet Take 1 tablet by mouth daily.       omeprazole (PRILOSEC) 40 MG capsule Take 40 mg by mouth daily.     ondansetron (ZOFRAN) 4 MG tablet Take 1 tablet (4 mg total) by mouth every 8 (eight) hours as needed for nausea or vomiting. 20 tablet 5   propranolol (INDERAL) 20 MG tablet Take 20 mg by mouth 2 (two) times daily.     SUMAtriptan (IMITREX) 100 MG tablet Take 1 tablet (100 mg total) by mouth as needed for migraine. May repeat in 2 hours if headache persists or recurs.  Maximum 2 tablets in 24 hours. 10 tablet 5   No current facility-administered medications on file prior to visit.    ALLERGIES: No Known Allergies  FAMILY HISTORY: Family History  Problem Relation Age of Onset   Alzheimer's disease Maternal Grandfather    Migraines Son    Breast cancer Neg Hx       Objective:  *** General: No acute distress.  Patient appears well-groomed.   Head:  Normocephalic/atraumatic Eyes:  Fundi examined but not visualized Neck: supple, no paraspinal tenderness, full range of motion Heart:  Regular rate and rhythm Neurological Exam: ***   Metta Clines, DO  CC: Lujean Amel, MD

## 2022-04-28 ENCOUNTER — Ambulatory Visit: Payer: 59 | Admitting: Neurology

## 2022-04-28 ENCOUNTER — Encounter: Payer: Self-pay | Admitting: Neurology

## 2022-04-28 VITALS — BP 152/85 | HR 66 | Ht 66.0 in | Wt 179.4 lb

## 2022-04-28 DIAGNOSIS — G43009 Migraine without aura, not intractable, without status migrainosus: Secondary | ICD-10-CM

## 2022-04-28 MED ORDER — SUMATRIPTAN SUCCINATE 100 MG PO TABS: 100.0000 mg | ORAL_TABLET | ORAL | 5 refills | Status: DC | PRN

## 2022-04-28 MED ORDER — AIMOVIG 140 MG/ML ~~LOC~~ SOAJ: 140.0000 mg | SUBCUTANEOUS | 5 refills | Status: DC

## 2022-04-28 NOTE — Patient Instructions (Signed)
Aimovig '140mg'$  every 4 weeks Sumatriptan and ondansetron as needed.  Limit use of pain relievers to no more than 2 days out of week to prevent risk of rebound or medication-overuse headache. Keep headache diary

## 2022-05-11 ENCOUNTER — Encounter (HOSPITAL_BASED_OUTPATIENT_CLINIC_OR_DEPARTMENT_OTHER): Payer: Self-pay

## 2022-05-11 ENCOUNTER — Emergency Department (HOSPITAL_BASED_OUTPATIENT_CLINIC_OR_DEPARTMENT_OTHER)
Admission: EM | Admit: 2022-05-11 | Discharge: 2022-05-11 | Disposition: A | Discharge status: Home / Self Care | Payer: 59 | Attending: Emergency Medicine | Admitting: Emergency Medicine

## 2022-05-11 ENCOUNTER — Other Ambulatory Visit: Payer: Self-pay

## 2022-05-11 DIAGNOSIS — R21 Rash and other nonspecific skin eruption: Secondary | ICD-10-CM | POA: Diagnosis not present

## 2022-05-11 DIAGNOSIS — H1132 Conjunctival hemorrhage, left eye: Secondary | ICD-10-CM

## 2022-05-11 MED ORDER — PREDNISONE 10 MG (21) PO TBPK: ORAL_TABLET | Freq: Every day | ORAL | 0 refills | Status: DC

## 2022-05-11 NOTE — ED Triage Notes (Signed)
Pt arrives with c/o rash on right side of neck, back, and buttock area that started yesterday. Pt also c/o a ruptured blood vessel in left eye.

## 2022-05-11 NOTE — ED Notes (Signed)
Discharge paperwork reviewed entirely with patient, including Rx's and follow up care. Pain was under control. Pt verbalized understanding as well as all parties involved. No questions or concerns voiced at the time of discharge. No acute distress noted.   Pt ambulated out to PVA without incident or assistance.  

## 2022-05-11 NOTE — Discharge Instructions (Signed)
Please make an appointment with the ophthalmologist I have attached here regarding recent eye symptoms.  Please return to ER if you start having eye pain, vision loss.  Please pick up the steroids I have prescribed for you for your itching.  You may take Benadryl at night and Claritin or Zyrtec in the morning for the itching.  If you begin to notice your throat swelling, unable to swallow , food, fluids please return to ER.

## 2022-05-11 NOTE — ED Provider Notes (Signed)
[facility] Provider Note   CSN: [id number] Arrival date & time: 05/11/22  1619     History  Chief Complaint  Patient presents with   Rash    [name] is a 47 y.o. female presented with a rash and a subconjunctival hemorrhage in her left eye that began today.  Patient states she woke up feeling itchy all throughout her body and took a Benadryl which seemed to help symptoms but patient is still itching.  Patient denied any throat swelling or facial swelling and is able to tolerate food and fluids orally.  Patient denied any difficulty breathing.  Patient denied any new lotions, soaps, foods, animals and does not know what triggered the symptoms.  Patient also stated throughout the day today her left eye had a burst of blood vessel and she now has a subconjunctival hemorrhage.  Patient denied any vision changes, eye pain, trauma.  Patient denied chest pain, shortness of breath, wheezing, nausea/vomiting, fevers, syncope  Home Medications Prior to Admission medications   Medication Sig Start Date End Date Taking? Authorizing Provider  cyclobenzaprine (FLEXERIL) 10 MG tablet Take 10 mg by mouth as needed. 07/07/14   [provider]  Erenumab-aooe (AIMOVIG) 140 MG/ML SOAJ Inject 140 mg into the skin every 28 (twenty-eight) days. 04/28/22   Pieter Partridge, DO  Ferrous Sulfate (IRON) 325 (65 Fe) MG TABS 1 tablet    [provider]  fluconazole (DIFLUCAN) 150 MG tablet 1 tablet 12/22/19   [provider]  fluticasone (FLONASE ALLERGY RELIEF) 50 MCG/ACT nasal spray 2 sprays 05/18/12   [provider]  metroNIDAZOLE (FLAGYL) 500 MG tablet Take by mouth. 12/22/19   [provider]  Multiple Vitamins-Minerals (MULTIVITAMIN WITH MINERALS) tablet Take 1 tablet by mouth daily.      [provider]  omeprazole (PRILOSEC) 40 MG capsule Take 40 mg by mouth daily. 12/04/18   [provider]   ondansetron (ZOFRAN) 4 MG tablet Take 1 tablet (4 mg total) by mouth every 8 (eight) hours as needed for nausea or vomiting. 09/25/21   Tomi Likens, Adam R, DO  predniSONE (STERAPRED UNI-PAK 21 TAB) 10 MG (21) TBPK tablet Take by mouth daily. Take 6 tabs by mouth daily  for 2 days, then 5 tabs for 2 days, then 4 tabs for 2 days, then 3 tabs for 2 days, 2 tabs for 2 days, then 1 tab by mouth daily for 2 days 05/11/22  Yes Herold Salguero, Florene Route, PA-C  propranolol (INDERAL) 20 MG tablet Take 20 mg by mouth 2 (two) times daily. 06/05/21   [provider]  SUMAtriptan (IMITREX) 100 MG tablet Take 1 tablet (100 mg total) by mouth as needed for migraine. May repeat in 2 hours if headache persists or recurs.  Maximum 2 tablets in 24 hours. 04/28/22   Pieter Partridge, DO      Allergies    Patient has no known allergies.    Review of Systems   Review of Systems  Skin:  Positive for rash.  See HPI  Physical Exam Updated Vital Signs BP (!) 174/99 (BP Location: Right Arm)   Pulse 73   Temp 98 F (36.7 C)   Resp 18   Wt 81.2 kg   LMP 09/30/2010   SpO2 100%   BMI 28.89 kg/m  Physical Exam Vitals reviewed.  Constitutional:      General: She is not in acute distress. HENT:     Head:  Normocephalic and atraumatic.  Eyes:     General: Lids are normal. Vision grossly intact.     Extraocular Movements: Extraocular movements intact.     Pupils: Pupils are equal, round, and reactive to light.     Visual Fields: Right eye visual fields normal and left eye visual fields normal.     Comments: Left eye subconjunctival hemorrhage Normal extraocular movements Patient denied any vision changes Bilateral pupils PERRL No swelling or discoloration around the eyes  Cardiovascular:     Rate and Rhythm: Normal rate and regular rhythm.     Pulses: Normal pulses.     Heart sounds: Normal heart sounds.     Comments: 2+ bilateral radial/dorsalis pedis pulses with regular rate Pulmonary:     Effort: Pulmonary effort  is normal. No respiratory distress.     Breath sounds: Normal breath sounds. No wheezing.  Abdominal:     Palpations: Abdomen is soft.     Tenderness: There is no abdominal tenderness. There is no guarding or rebound.  Musculoskeletal:        General: Normal range of motion.     Cervical back: Normal range of motion and neck supple.     Comments: 5 out of 5 bilateral grip/leg extension strength  Skin:    General: Skin is warm and dry.     Capillary Refill: Capillary refill takes less than 2 seconds.     Comments: No overlying skin color changes noted however scratch marks from where the patient been scratching were noted No urticaria noted No facial or neck swelling  Neurological:     General: No focal deficit present.     Mental Status: She is alert and oriented to person, place, and time.     Comments: Sensation intact in all 4 limbs  Psychiatric:        Mood and Affect: Mood normal.     ED Results / Procedures / Treatments   Labs (all labs ordered are listed, but only abnormal results are displayed) Labs Reviewed - No data to display  EKG None  Radiology No results found.  Procedures Procedures    Medications Ordered in ED Medications - No data to display  ED Course/ Medical Decision Making/ A&P                             Medical Decision Making Risk Prescription drug management.   [name] 47 y.o. presented today for facial swelling and subconjunctival hemorrhage. Working DDx that I considered at this time includes, but not limited to, subconjunctival hemorrhage, anaphylaxis, allergic dermatitis, Stevens-Johnson/TEN, necrotizing fasciitis, open globe rupture, orbital cellulitis.  R/o DDx: Anaphylaxis, Stevens-Johnson/TEN, necrotizing fasciitis, open globe rupture, orbital cellulitis: These are considered less likely due to history of present illness and physical exam findings  Review of prior external notes: None  Unique Tests and My  Interpretation: None  Discussion with Independent Historian: None  Discussion of Management of Tests: None  Risk:   Medium:  - prescription drug management  Risk Stratification Score: None  Plan: Patient presented for itching and subconjunctival hemorrhage. On exam patient was not in any distress and stable vitals.  On exam patient had scratch marks all throughout her upper extremities, torso, back and actively scratching at them.  I did not notice any overlying skin color changes.  Patient had a subconjunctival hemorrhage in her left eye but had a reassuring ocular exam.  At this time I  encouraged patient to continue taking the Benadryl at night and to take Claritin or Zyrtec in the morning I will also prescribe prednisone taper as I suspect this is a allergic dermatitis.  I will also give the patient an ophthalmologist to follow-up with regarding her subconjunctival hemorrhage.  Patient was given return precautions. Patient stable for discharge at this time.  Patient verbalized understanding of plan.         Final Clinical Impression(s) / ED Diagnoses Final diagnoses:  Rash  Subconjunctival hemorrhage of left eye    Rx / DC Orders ED Discharge Orders          Ordered    predniSONE (STERAPRED UNI-PAK 21 TAB) 10 MG (21) TBPK tablet  Daily        05/11/22 2052              Chuck Hint, PA-C 05/11/22 2109    Lorelle Gibbs, DO 05/11/22 2124

## 2022-09-26 ENCOUNTER — Ambulatory Visit: Payer: 59 | Admitting: Neurology

## 2022-11-05 ENCOUNTER — Other Ambulatory Visit: Payer: Self-pay | Admitting: Neurology

## 2022-11-24 ENCOUNTER — Telehealth: Payer: Self-pay | Admitting: Neurology

## 2022-11-24 DIAGNOSIS — Z0279 Encounter for issue of other medical certificate: Secondary | ICD-10-CM

## 2022-11-24 NOTE — Telephone Encounter (Signed)
Received FMLA forms. Fee has been paid. Forms are in Dr. Moises Blood box. The pt is aware he is currently out of the office.

## 2022-12-02 NOTE — Telephone Encounter (Signed)
Pt called in wanting to get the status of her FMLA forms

## 2022-12-04 NOTE — Telephone Encounter (Signed)
Advised patient, needed verification of questions.     Updated information for Dr.Jaffe on his desk.

## 2022-12-05 NOTE — Telephone Encounter (Signed)
Caller calling again about status of FMLA paperwork. Would like a call back from nurse

## 2022-12-08 NOTE — Telephone Encounter (Signed)
Faxed over to her job. Copies made.

## 2022-12-11 ENCOUNTER — Other Ambulatory Visit: Payer: Self-pay | Admitting: Family Medicine

## 2022-12-11 DIAGNOSIS — Z1231 Encounter for screening mammogram for malignant neoplasm of breast: Secondary | ICD-10-CM

## 2022-12-25 NOTE — Progress Notes (Unsigned)
NEUROLOGY FOLLOW UP OFFICE NOTE  [name] [id number]  Assessment/Plan:   Migraine without aura, without status migrainosus, not intractable, increased frequency   Migraine prevention:  Change from Aimovig to Ajovy Migraine rescue:  Change from sumatriptan to rizatriptan 10mg , Zofran 4mg .  Limit use of pain relievers to no more than 2 days out of week to prevent risk of rebound or medication-overuse headache. Keep headache diary Follow up 6 months.       Subjective:  [name] is a 47 year old female with unspecified anemia who follows up for migraines.  UPDATE: Intensity:  mild to severe Duration:  Usually a couple of hours with sumatriptan and nap.  Sometimes needs to repeat dose.   However, she doesn't like the way sumatriptan makes her feel. Frequency:  10 headache days a month (5 days were severe/migraines) This past month, she had two weeks of nausea that aggravated her migraines.  Uncertain etiology.  She has had one or two other similar episodes this past year.    Current NSAIDS/analgesics:  Tylenol (rare) Current triptans:  sumatriptan 100mg  Current ergotamine:  none Current anti-emetic:  Zofran 4mg  Current muscle relaxants:  cyclobenzaprine 10mg  QD PRN Current Antihypertensive medications:  none Current Antidepressant medications:  none Current Anticonvulsant medications:  none Current anti-CGRP:  Aimovig 140mg  Current Antihistamines/Decongestants:  Flonase Other therapy:  none Hormone/birth control:  none Other medications:  buspirone     Caffeine:  8-12 oz coffee but not daily Depression:  no; Anxiety:  stable Sleep hygiene:  Poor.  Wakes up during night at least 3 times to use bathroom  HISTORY:  Onset:  since her 61s but became worse during pregnancy and since her late-30s Location:  temples (unilateral or bilateral), bi-frontal, back of head and neck, top of head, one time radiated down face. Quality:  throbbing, pounding, squeezing.   Sometimes burning sensation of scalp Intensity:  10/10.   Aura:  no preceding aura Prodrome:  absent Associated symptoms:  Nausea, photophobia, phonophobia, sometimes floaters.  She denies associated vomiting, unilateral numbness or weakness. Duration:  all day Frequency:  daily Frequency of abortive medication: 3 days a week Triggers:  sleep deprivation, too much sleep, quick movements, stress, certain strong smells), too much caffeine or caffeine withdrawal, pregnancy Relieving factors:  close eyes, heating pad Activity:  aggravates   Past NSAIDS/analgesics:  Fioricet, ibuprofen (increased blood pressure), Tylenol, BC Past abortive triptans:  none Past abortive ergotamine:  none Past muscle relaxants:  none Past anti-emetic:  none Past antihypertensive medications:  propranolol Past antidepressant medications:  none Past anticonvulsant medications:  topiramate Past anti-CGRP:  none Past vitamins/Herbal/Supplements:  none Past antihistamines/decongestants:  none Other past therapies:  massage (helped), chiropractic medicine (helped)    Family history of headache:  son  PAST MEDICAL HISTORY: Past Medical History:  Diagnosis Date   Bladder infection 05/13/2010   Blood dyscrasia    takes B12   Chicken pox 05/13/2010   Endometriosis    Fibroids 10/10/2010   GERD (gastroesophageal reflux disease)    otc Prilosec when needed   Hx of gonorrhea    Irritable bowel 05/13/2010   Lichen sclerosus et atrophicus of the vulva 10/10/2010   Low iron    During pregnancy   Migraines 05/13/2010   Pelvic pain    s/p hysterectomy   PONV (postoperative nausea and vomiting)    Yeast infection     MEDICATIONS: Current Outpatient Medications on File Prior to Visit  Medication Sig Dispense Refill  cyclobenzaprine (FLEXERIL) 10 MG tablet Take 10 mg by mouth as needed.     Erenumab-aooe (AIMOVIG) 140 MG/ML SOAJ Inject 140 mg into the skin every 28 (twenty-eight) days. 1.12 mL 5   Ferrous Sulfate  (IRON) 325 (65 Fe) MG TABS 1 tablet     fluconazole (DIFLUCAN) 150 MG tablet 1 tablet     fluticasone (FLONASE ALLERGY RELIEF) 50 MCG/ACT nasal spray 2 sprays     metroNIDAZOLE (FLAGYL) 500 MG tablet Take by mouth.     Multiple Vitamins-Minerals (MULTIVITAMIN WITH MINERALS) tablet Take 1 tablet by mouth daily.       omeprazole (PRILOSEC) 40 MG capsule Take 40 mg by mouth daily.     ondansetron (ZOFRAN) 4 MG tablet Take 1 tablet (4 mg total) by mouth every 8 (eight) hours as needed for nausea or vomiting. 20 tablet 5   predniSONE (STERAPRED UNI-PAK 21 TAB) 10 MG (21) TBPK tablet Take by mouth daily. Take 6 tabs by mouth daily  for 2 days, then 5 tabs for 2 days, then 4 tabs for 2 days, then 3 tabs for 2 days, 2 tabs for 2 days, then 1 tab by mouth daily for 2 days 42 tablet 0   propranolol (INDERAL) 20 MG tablet Take 20 mg by mouth 2 (two) times daily.     SUMAtriptan (IMITREX) 100 MG tablet TAKE 1 TABLET BY MOUTH AS NEEDED FOR MIGRAINE. MAY REPEAT IN 2 HOURS IF HEADACHE PERSISTS OR RECURS. MAXIMUM 2 TABLETS IN 24 HOURS. 10 tablet 5   No current facility-administered medications on file prior to visit.    ALLERGIES: No Known Allergies  FAMILY HISTORY: Family History  Problem Relation Age of Onset   Alzheimer's disease Maternal Grandfather    Migraines Son    Breast cancer Neg Hx       Objective:  Blood pressure (!) 144/82, pulse 69, height 5\' 6"  (1.676 m), weight 180 lb (81.6 kg), last menstrual period 09/30/2010, SpO2 96%. General: No acute distress.  Patient appears well-groomed.   Head:  Normocephalic/atraumatic Neck:  Supple.  No paraspinal tenderness.  Full range of motion. Heart:  Regular rate and rhythm. Neuro:  Alert and oriented.  Speech fluent and not dysarthric.  Language intact.  CN II-XII intact.  Bulk and tone normal.  Muscle strength 5/5 throughout.  Deep tendon reflexes 2+ throughout.  Gait normal.  Romberg negative.   Shon Millet, DO  CC: Darrow Bussing,  MD

## 2022-12-29 ENCOUNTER — Ambulatory Visit: Payer: 59 | Admitting: Neurology

## 2022-12-29 ENCOUNTER — Encounter: Payer: Self-pay | Admitting: Neurology

## 2022-12-29 ENCOUNTER — Other Ambulatory Visit: Payer: Self-pay | Admitting: Neurology

## 2022-12-29 VITALS — BP 144/82 | HR 69 | Ht 66.0 in | Wt 180.0 lb

## 2022-12-29 DIAGNOSIS — G43009 Migraine without aura, not intractable, without status migrainosus: Secondary | ICD-10-CM | POA: Diagnosis not present

## 2022-12-29 MED ORDER — RIZATRIPTAN BENZOATE 10 MG PO TABS: 10.0000 mg | ORAL_TABLET | ORAL | 5 refills | Status: DC | PRN

## 2022-12-29 MED ORDER — AJOVY 225 MG/1.5ML ~~LOC~~ SOAJ: 225.0000 mg | SUBCUTANEOUS | 11 refills | Status: DC

## 2022-12-29 NOTE — Patient Instructions (Signed)
Change Aimovig to Ajovy injection every 28 days Stop sumatriptan.  Instead, try rizatriptan.  May repeat after 2 hours.  Maximum 2 tablets in 24 hours. Limit use of pain relievers to no more than 2 days out of week to prevent risk of rebound or medication-overuse headache. Keep headache Follow up 6 months.

## 2022-12-31 ENCOUNTER — Other Ambulatory Visit: Payer: Self-pay | Admitting: Neurology

## 2022-12-31 MED ORDER — EMGALITY 120 MG/ML ~~LOC~~ SOAJ: 240.0000 mg | Freq: Once | SUBCUTANEOUS | 0 refills | Status: AC

## 2022-12-31 NOTE — Addendum Note (Signed)
Addended by: Leida Lauth on: 12/31/2022 08:32 AM   Modules accepted: Orders

## 2022-12-31 NOTE — Telephone Encounter (Signed)
Patient of advised of medication change due to coverage.

## 2023-01-09 ENCOUNTER — Ambulatory Visit
Admission: RE | Admit: 2023-01-09 | Discharge: 2023-01-09 | Disposition: A | Discharge status: Home / Self Care | Payer: 59 | Source: Ambulatory Visit | Attending: Family Medicine | Admitting: Family Medicine

## 2023-01-09 DIAGNOSIS — Z1231 Encounter for screening mammogram for malignant neoplasm of breast: Secondary | ICD-10-CM

## 2023-03-25 ENCOUNTER — Encounter: Payer: Self-pay | Admitting: Neurology

## 2023-06-29 ENCOUNTER — Ambulatory Visit: Payer: 59 | Admitting: Neurology

## 2023-07-07 NOTE — Progress Notes (Unsigned)
 NEUROLOGY FOLLOW UP OFFICE NOTE  MELBA ZOPFI [id number]  Assessment/Plan:   Migraine without aura, without status migrainosus, not intractable, increased frequency   Migraine prevention:  Will again prescribe Emgality  and send for prior authorization. Migraine rescue:  Stop rizatriptan .  Instead, will try samples of Ubrelvy 100mg , Zofran  4mg .  Limit use of pain relievers to no more than 9 days out of the month to prevent risk of rebound or medication-overuse headache. Keep headache diary Follow up with PCP regarding BP Follow up 6 months.       Subjective:  [name] is a 48 year old female with unspecified anemia who follows up for migraines.  UPDATE: Changed from Aimovig  to Emgality .  However, it appears that a prior authorization request was not initiated.  She is not on a preventative. Intensity:  mild to severe Duration:  Usually a couple of hours with rizatriptan  but doesn't like how it makes her feel. Frequency:  since she hasn't been on any preventative, she has a daily headache but they are severe at least 1 to 2 times a week.    Current NSAIDS/analgesics:  Tylenol  (rare) Current triptans:  rizatriptan  10mg  Current ergotamine:  none Current anti-emetic:  Zofran  4mg  Current muscle relaxants:  cyclobenzaprine 10mg  QD PRN Current Antihypertensive medications:  none Current Antidepressant medications:  none Current Anticonvulsant medications:  none Current anti-CGRP:  Aimovig  140mg  Current Antihistamines/Decongestants:  Flonase Other therapy:  none Hormone/birth control:  none Other medications:  buspirone     Caffeine:  8-12 oz coffee but not daily Depression:  no; Anxiety:  stable Sleep hygiene:  Poor.  Wakes up during night at least 3 times to use bathroom  HISTORY:  Onset:  since her 57s but became worse during pregnancy and since her late-30s Location:  temples (unilateral or bilateral), bi-frontal, back of head and neck, top of head, one time  radiated down face. Quality:  throbbing, pounding, squeezing.  Sometimes burning sensation of scalp Intensity:  10/10.   Aura:  no preceding aura Prodrome:  absent Associated symptoms:  Nausea, photophobia, phonophobia, sometimes floaters.  She denies associated vomiting, unilateral numbness or weakness. Duration:  all day Frequency:  daily Frequency of abortive medication: 3 days a week Triggers:  sleep deprivation, too much sleep, quick movements, stress, certain strong smells), too much caffeine or caffeine withdrawal, pregnancy Relieving factors:  close eyes, heating pad Activity:  aggravates   Past NSAIDS/analgesics:  Fioricet, ibuprofen  (increased blood pressure), Tylenol , BC Past abortive triptans:  sumatriptan  tab (does not like how it makes her feel) Past abortive ergotamine:  none Past muscle relaxants:  none Past anti-emetic:  none Past antihypertensive medications:  propranolol Past antidepressant medications:  none Past anticonvulsant medications:  topiramate Past anti-CGRP:  none Past vitamins/Herbal/Supplements:  none Past antihistamines/decongestants:  none Other past therapies:  massage (helped), chiropractic medicine (helped)    Family history of headache:  son  PAST MEDICAL HISTORY: Past Medical History:  Diagnosis Date   Bladder infection 05/13/2010   Blood dyscrasia    takes B12   Chicken pox 05/13/2010   Endometriosis    Fibroids 10/10/2010   GERD (gastroesophageal reflux disease)    otc Prilosec when needed   Hx of gonorrhea    Irritable bowel 05/13/2010   Lichen sclerosus et atrophicus of the vulva 10/10/2010   Low iron    During pregnancy   Migraines 05/13/2010   Pelvic pain    s/p hysterectomy   PONV (postoperative nausea and vomiting)  Yeast infection     MEDICATIONS: Current Outpatient Medications on File Prior to Visit  Medication Sig Dispense Refill   cyclobenzaprine (FLEXERIL) 10 MG tablet Take 10 mg by mouth as needed.     Ferrous  Sulfate (IRON) 325 (65 Fe) MG TABS 1 tablet     fluconazole  (DIFLUCAN ) 150 MG tablet 1 tablet     fluticasone (FLONASE ALLERGY RELIEF) 50 MCG/ACT nasal spray 2 sprays     metroNIDAZOLE (FLAGYL) 500 MG tablet Take by mouth.     Multiple Vitamins-Minerals (MULTIVITAMIN WITH MINERALS) tablet Take 1 tablet by mouth daily.       omeprazole (PRILOSEC) 40 MG capsule Take 40 mg by mouth daily.     ondansetron  (ZOFRAN ) 4 MG tablet Take 1 tablet (4 mg total) by mouth every 8 (eight) hours as needed for nausea or vomiting. 20 tablet 5   propranolol (INDERAL) 20 MG tablet Take 20 mg by mouth 2 (two) times daily. (Patient not taking: Reported on 12/29/2022)     rizatriptan  (MAXALT ) 10 MG tablet Take 1 tablet (10 mg total) by mouth as needed for migraine. May repeat in 2 hours if needed.  Maximum 2 tablets in 24 hours. 10 tablet 5   No current facility-administered medications on file prior to visit.    ALLERGIES: No Known Allergies  FAMILY HISTORY: Family History  Problem Relation Age of Onset   Breast cancer Maternal Aunt    Alzheimer's disease Maternal Grandfather    Migraines Son       Objective:  Blood pressure (!) 157/79, pulse 66, height 5\' 6"  (1.676 m), weight 179 lb 3.2 oz (81.3 kg), last menstrual period 09/30/2010, SpO2 100%. General: No acute distress.  Patient appears well-groomed.      Janne Members, DO  CC: Lanae Pinal, MD

## 2023-07-08 ENCOUNTER — Ambulatory Visit: Payer: 59 | Admitting: Neurology

## 2023-07-08 VITALS — BP 157/79 | HR 66 | Ht 66.0 in | Wt 179.2 lb

## 2023-07-08 DIAGNOSIS — G43009 Migraine without aura, not intractable, without status migrainosus: Secondary | ICD-10-CM

## 2023-07-08 MED ORDER — EMGALITY 120 MG/ML ~~LOC~~ SOAJ: 240.0000 mg | Freq: Once | SUBCUTANEOUS | 0 refills | Status: AC

## 2023-07-08 NOTE — Patient Instructions (Signed)
 Plan to start Emgality  - 2 injections for first dose, then one injection every 28 days thereafter.  Contact me once you pick up starting dose and I will send in prescription for standing order.  Take ubrelvy at earliest onset of migraine.  May repeat after 2 hours.  Maximum 2 tablets in 24 hours.  Let me know if you like it. Limit use of pain relievers to no more than 9 days out of the month to prevent risk of rebound or medication-overuse headache. Keep headache diary Follow up 6 months.

## 2023-07-09 ENCOUNTER — Telehealth: Payer: Self-pay

## 2023-07-09 ENCOUNTER — Encounter: Payer: Self-pay | Admitting: Neurology

## 2023-07-09 NOTE — Telephone Encounter (Signed)
 PA needed for [employer]

## 2023-07-10 ENCOUNTER — Other Ambulatory Visit (HOSPITAL_COMMUNITY): Payer: Self-pay

## 2023-07-10 ENCOUNTER — Telehealth: Payer: Self-pay | Admitting: Pharmacy Technician

## 2023-07-10 NOTE — Telephone Encounter (Signed)
 Pharmacy Patient Advocate Encounter   Received notification from Pt Calls Messages that prior authorization for EMGALITY  120MG  is required/requested.   Insurance verification completed.   The patient is insured through [facility] .   Per test claim: PA required; PA submitted to above mentioned insurance via CoverMyMeds Key/confirmation #/EOC [id number] Status is pending

## 2023-07-10 NOTE — Telephone Encounter (Signed)
 PA has been submitted, and telephone encounter has been created. Please see telephone encounter dated 5.9.25.

## 2023-07-10 NOTE — Telephone Encounter (Signed)
 Pharmacy Patient Advocate Encounter  Received notification from [employer] that Prior Authorization for EMGALITY  120MG  has been APPROVED from 5.9.25 to 5.9.26. Ran test claim, Copay is $35. This test claim was processed through [facility]- copay amounts may vary at other pharmacies due to pharmacy/plan contracts, or as the patient moves through the different stages of their insurance plan.   PA #/Case ID/Reference #: [id number]

## 2023-07-14 ENCOUNTER — Other Ambulatory Visit (HOSPITAL_COMMUNITY): Payer: Self-pay

## 2023-07-29 ENCOUNTER — Telehealth: Payer: Self-pay | Admitting: Neurology

## 2023-07-29 NOTE — Telephone Encounter (Signed)
 Per note 07-10-23 Pharmacy Patient Advocate Encounter   Received notification from [employer] that Prior Authorization for EMGALITY  120MG  has been APPROVED from 5.9.25 to 5.9.26. Ran test claim, Copay is $35. This test claim was processed through [facility]- copay amounts may vary at other pharmacies due to pharmacy/plan contracts, or as the patient moves through the different stages of their insurance plan.    PA #/Case ID/Reference #: [id number]

## 2023-07-29 NOTE — Telephone Encounter (Signed)
 Patient called and states that the Samples of the Ubrelevy worked for her and she would like to get it called in to the [facility]  and also she wants to check on the Emgality  prior auth as well.  She also needs the medication for the nausea to be called in as well. ( Not sure of the name of that )

## 2023-08-10 NOTE — Telephone Encounter (Signed)
 Pt calling back would like update on Meds

## 2023-08-11 ENCOUNTER — Telehealth: Payer: Self-pay | Admitting: Pharmacist

## 2023-08-11 MED ORDER — ONDANSETRON HCL 4 MG PO TABS: 4.0000 mg | ORAL_TABLET | Freq: Three times a day (TID) | ORAL | 5 refills | Status: DC | PRN

## 2023-08-11 MED ORDER — UBRELVY 100 MG PO TABS: 100.0000 mg | ORAL_TABLET | ORAL | 5 refills | Status: DC | PRN

## 2023-08-11 MED ORDER — EMGALITY 120 MG/ML ~~LOC~~ SOAJ: 240.0000 mg | SUBCUTANEOUS | 0 refills | Status: DC

## 2023-08-11 NOTE — Telephone Encounter (Signed)
 Patient advised.

## 2023-08-11 NOTE — Telephone Encounter (Signed)
 Pharmacy Patient Advocate Encounter   Received notification from Patient Pharmacy that prior authorization for Ubrelvy 100MG  tablets is required/requested.   Insurance verification completed.   The patient is insured through [facility] .   Per test claim: PA required; PA submitted to above mentioned insurance via CoverMyMeds Key/confirmation #/EOC [id number] Status is pending

## 2023-08-11 NOTE — Telephone Encounter (Signed)
 Pharmacy Patient Advocate Encounter  Received notification from [employer] that Prior Authorization for Ubrelvy 100MG  tablets has been APPROVED from 08/11/2023 to 08/10/2024   PA #/Case ID/Reference #: [id number]

## 2023-09-16 ENCOUNTER — Telehealth: Payer: Self-pay | Admitting: Neurology

## 2023-09-16 DIAGNOSIS — Z0279 Encounter for issue of other medical certificate: Secondary | ICD-10-CM

## 2023-09-16 NOTE — Telephone Encounter (Signed)
 Received FMLA paperwork for this patient. The patient would like for them to be filled out and for them to be faxed in once completed. She paid the $25 form fee.   Forms are in Dr. Jayme box

## 2023-09-17 NOTE — Telephone Encounter (Signed)
Please review and sign. On your desk

## 2023-10-07 ENCOUNTER — Other Ambulatory Visit: Payer: Self-pay | Admitting: Neurology

## 2024-01-22 NOTE — Progress Notes (Unsigned)
 NEUROLOGY FOLLOW UP OFFICE NOTE  [name] [id number]  Assessment/Plan:   Migraine without aura, without status migrainosus, not intractable, increased frequency   Migraine prevention:  Emgality  *** Migraine rescue:  Ubrelvy  100mg  ***, Zofran  4mg .  Limit use of pain relievers to no more than 9 days out of the month to prevent risk of rebound or medication-overuse headache. Keep headache diary Follow up 7 months.       Subjective:  [name] is a 48 year old female with unspecified anemia who follows up for migraines.  UPDATE: Started Emgality . Intensity:  mild to severe Duration:  Usually a couple of hours with rizatriptan  but doesn't like how it makes her feel. Frequency:  since she hasn't been on any preventative, she has a daily headache but they are severe at least 1 to 2 times a week.    Current NSAIDS/analgesics:  Tylenol  (rare) Current triptans:  none Current ergotamine:  none Current anti-emetic:  Zofran  4mg  Current muscle relaxants:  cyclobenzaprine  10mg  QD PRN Current Antihypertensive medications:  none Current Antidepressant medications:  none Current Anticonvulsant medications:  none Current anti-CGRP:  Emgality , Ubrelvy  100mg  Current Antihistamines/Decongestants:  Flonase Other therapy:  none Hormone/birth control:  none Other medications:  buspirone     Caffeine:  8-12 oz coffee but not daily Depression:  no; Anxiety:  stable Sleep hygiene:  Poor.  Wakes up during night at least 3 times to use bathroom  HISTORY:  Onset:  since her 5s but became worse during pregnancy and since her late-30s Location:  temples (unilateral or bilateral), bi-frontal, back of head and neck, top of head, one time radiated down face. Quality:  throbbing, pounding, squeezing.  Sometimes burning sensation of scalp Intensity:  10/10.   Aura:  no preceding aura Prodrome:  absent Associated symptoms:  Nausea, photophobia, phonophobia, sometimes floaters.  She  denies associated vomiting, unilateral numbness or weakness. Duration:  all day Frequency:  daily Frequency of abortive medication: 3 days a week Triggers:  sleep deprivation, too much sleep, quick movements, stress, certain strong smells), too much caffeine or caffeine withdrawal, pregnancy Relieving factors:  close eyes, heating pad Activity:  aggravates   Past NSAIDS/analgesics:  Fioricet, ibuprofen  (increased blood pressure), Tylenol , BC Past abortive triptans:  sumatriptan  tab (does not like how it makes her feel), rizatriptan  100mg  Past abortive ergotamine:  none Past muscle relaxants:  none Past anti-emetic:  none Past antihypertensive medications:  propranolol Past antidepressant medications:  none Past anticonvulsant medications:  topiramate Past anti-CGRP:  Aimovig  Past vitamins/Herbal/Supplements:  none Past antihistamines/decongestants:  none Other past therapies:  massage (helped), chiropractic medicine (helped)    Family history of headache:  son  PAST MEDICAL HISTORY: Past Medical History:  Diagnosis Date   Bladder infection 05/13/2010   Blood dyscrasia    takes B12   Chicken pox 05/13/2010   Endometriosis    Fibroids 10/10/2010   GERD (gastroesophageal reflux disease)    otc Prilosec when needed   Hx of gonorrhea    Irritable bowel 05/13/2010   Lichen sclerosus et atrophicus of the vulva 10/10/2010   Low iron    During pregnancy   Migraines 05/13/2010   Pelvic pain    s/p hysterectomy   PONV (postoperative nausea and vomiting)    Yeast infection     MEDICATIONS: Current Outpatient Medications on File Prior to Visit  Medication Sig Dispense Refill   cyclobenzaprine  (FLEXERIL ) 10 MG tablet Take 10 mg by mouth as needed.  Ferrous Sulfate  (IRON) 325 (65 Fe) MG TABS 1 tablet     fluconazole  (DIFLUCAN ) 150 MG tablet 1 tablet (Patient not taking: Reported on 07/08/2023)     fluticasone (FLONASE ALLERGY RELIEF) 50 MCG/ACT nasal spray 2 sprays      Galcanezumab -gnlm (EMGALITY ) 120 MG/ML SOAJ Inject 240 mg into the skin every 28 (twenty-eight) days. 2 mL 0   metroNIDAZOLE (FLAGYL) 500 MG tablet Take by mouth. (Patient not taking: Reported on 07/08/2023)     Multiple Vitamins-Minerals (MULTIVITAMIN WITH MINERALS) tablet Take 1 tablet by mouth daily.       omeprazole (PRILOSEC) 40 MG capsule Take 40 mg by mouth daily.     ondansetron  (ZOFRAN ) 4 MG tablet Take 1 tablet (4 mg total) by mouth every 8 (eight) hours as needed for nausea or vomiting. 20 tablet 5   propranolol (INDERAL) 20 MG tablet Take 20 mg by mouth 2 (two) times daily. (Patient not taking: Reported on 07/08/2023)     rizatriptan  (MAXALT ) 10 MG tablet Take 1 tablet (10 mg total) by mouth as needed for migraine. May repeat in 2 hours if needed.  Maximum 2 tablets in 24 hours. 10 tablet 5   Ubrogepant  (UBRELVY ) 100 MG TABS Take 1 tablet (100 mg total) by mouth as needed. 16 tablet 5   No current facility-administered medications on file prior to visit.    ALLERGIES: No Known Allergies  FAMILY HISTORY: Family History  Problem Relation Age of Onset   Breast cancer Maternal Aunt    Alzheimer's disease Maternal Grandfather    Migraines Son       Objective:  *** General: No acute distress.  Patient appears well-groomed.   Head:  Normocephalic/atraumatic Neck:  Supple.  No paraspinal tenderness.  Full range of motion. Heart:  Regular rate and rhythm. Neuro:  Alert and oriented.  Speech fluent and not dysarthric.  Language intact.  CN II-XII intact.  Bulk and tone normal.  Muscle strength 5/5 throughout.  Sensation to light touch intact.  Deep tendon reflexes 2+ throughout, toes downgoing.  Gait normal.  Romberg negative.    Juliene Dunnings, DO  CC: Elfrieda Haggard, MD

## 2024-01-25 ENCOUNTER — Encounter: Payer: Self-pay | Admitting: Neurology

## 2024-01-25 ENCOUNTER — Ambulatory Visit: Admitting: Neurology

## 2024-01-25 VITALS — BP 134/82 | HR 104 | Ht 65.0 in | Wt 176.0 lb

## 2024-01-25 DIAGNOSIS — G44229 Chronic tension-type headache, not intractable: Secondary | ICD-10-CM | POA: Diagnosis not present

## 2024-01-25 DIAGNOSIS — G43009 Migraine without aura, not intractable, without status migrainosus: Secondary | ICD-10-CM

## 2024-01-25 MED ORDER — ONDANSETRON HCL 4 MG PO TABS: 4.0000 mg | ORAL_TABLET | Freq: Three times a day (TID) | ORAL | 5 refills | Status: AC | PRN

## 2024-01-25 MED ORDER — EMGALITY 120 MG/ML ~~LOC~~ SOAJ: 120.0000 mg | SUBCUTANEOUS | 5 refills | Status: AC

## 2024-01-25 MED ORDER — NORTRIPTYLINE HCL 10 MG PO CAPS: 10.0000 mg | ORAL_CAPSULE | Freq: Every day | ORAL | 5 refills | Status: DC

## 2024-01-25 MED ORDER — CYCLOBENZAPRINE HCL 10 MG PO TABS: 10.0000 mg | ORAL_TABLET | Freq: Three times a day (TID) | ORAL | 5 refills | Status: AC | PRN

## 2024-01-25 MED ORDER — UBRELVY 100 MG PO TABS: 100.0000 mg | ORAL_TABLET | ORAL | 5 refills | Status: AC | PRN

## 2024-01-25 NOTE — Patient Instructions (Signed)
 Continue Emgality  every 4 weeks Start nortriptyline  10mg  at bedtime to reduce frequent headaches.  If no improvement in 4 weeks, contact me and we can increase dose. At earliest onset of neck tightness, try cyclobenzaprine  (caution for drowsiness) At earliest onset of migraine, Ubrelvy  100mg , zofran  for nausea Limit use of pain relievers to no more than 9 days out of the month to prevent risk of rebound or medication-overuse headache. Keep headache diary Follow up 7 months.

## 2024-02-17 ENCOUNTER — Other Ambulatory Visit: Payer: Self-pay | Admitting: Neurology

## 2024-03-18 ENCOUNTER — Other Ambulatory Visit: Payer: Self-pay | Admitting: Family Medicine

## 2024-03-18 DIAGNOSIS — Z1231 Encounter for screening mammogram for malignant neoplasm of breast: Secondary | ICD-10-CM

## 2024-04-04 ENCOUNTER — Ambulatory Visit

## 2024-04-22 ENCOUNTER — Ambulatory Visit

## 2024-08-29 ENCOUNTER — Ambulatory Visit: Admitting: Neurology
